# Patient Record
Sex: Female | Born: 1940 | Race: White | Hispanic: No | Smoking: Never smoker
Health system: Southern US, Community
[De-identification: ages and names within clinical notes are randomized; demographics above are authoritative.]

## PROBLEM LIST (undated history)

## (undated) DIAGNOSIS — E119 Type 2 diabetes mellitus without complications: Secondary | ICD-10-CM

## (undated) DIAGNOSIS — K589 Irritable bowel syndrome without diarrhea: Secondary | ICD-10-CM

## (undated) DIAGNOSIS — R569 Unspecified convulsions: Secondary | ICD-10-CM

## (undated) DIAGNOSIS — I639 Cerebral infarction, unspecified: Secondary | ICD-10-CM

## (undated) DIAGNOSIS — K921 Melena: Secondary | ICD-10-CM

## (undated) DIAGNOSIS — M519 Unspecified thoracic, thoracolumbar and lumbosacral intervertebral disc disorder: Secondary | ICD-10-CM

## (undated) DIAGNOSIS — K529 Noninfective gastroenteritis and colitis, unspecified: Secondary | ICD-10-CM

## (undated) DIAGNOSIS — K648 Other hemorrhoids: Secondary | ICD-10-CM

## (undated) DIAGNOSIS — I10 Essential (primary) hypertension: Secondary | ICD-10-CM

## (undated) HISTORY — DX: Other hemorrhoids: K64.8

## (undated) HISTORY — DX: Noninfective gastroenteritis and colitis, unspecified: K52.9

## (undated) HISTORY — PX: OTHER SURGICAL HISTORY: SHX169

## (undated) HISTORY — PX: APPENDECTOMY: SHX54

## (undated) HISTORY — DX: Irritable bowel syndrome without diarrhea: K58.9

## (undated) HISTORY — DX: Unspecified thoracic, thoracolumbar and lumbosacral intervertebral disc disorder: M51.9

## (undated) HISTORY — PX: BREAST REDUCTION SURGERY: SHX8

## (undated) HISTORY — PX: CHOLECYSTECTOMY: SHX55

## (undated) HISTORY — DX: Unspecified convulsions: R56.9

## (undated) HISTORY — PX: ABDOMINAL HYSTERECTOMY: SHX81

## (undated) HISTORY — DX: Melena: K92.1

## (undated) HISTORY — PX: FOOT SURGERY: SHX648

---

## 2000-08-14 ENCOUNTER — Encounter: Admission: RE | Admit: 2000-08-14 | Discharge: 2000-08-14 | Payer: Self-pay | Admitting: Family Medicine

## 2000-08-14 ENCOUNTER — Encounter: Payer: Self-pay | Admitting: Family Medicine

## 2002-02-18 HISTORY — PX: COLONOSCOPY: SHX174

## 2004-02-19 HISTORY — PX: COLONOSCOPY: SHX174

## 2004-06-29 ENCOUNTER — Ambulatory Visit: Payer: Self-pay | Admitting: Internal Medicine

## 2004-07-04 ENCOUNTER — Ambulatory Visit (HOSPITAL_COMMUNITY): Admission: RE | Admit: 2004-07-04 | Discharge: 2004-07-04 | Payer: Self-pay | Admitting: Internal Medicine

## 2004-07-04 ENCOUNTER — Ambulatory Visit: Payer: Self-pay | Admitting: Internal Medicine

## 2004-08-30 ENCOUNTER — Ambulatory Visit: Payer: Self-pay | Admitting: Internal Medicine

## 2005-01-17 ENCOUNTER — Ambulatory Visit: Payer: Self-pay | Admitting: Internal Medicine

## 2005-07-10 ENCOUNTER — Ambulatory Visit: Payer: Self-pay | Admitting: Internal Medicine

## 2005-08-26 ENCOUNTER — Ambulatory Visit: Payer: Self-pay | Admitting: Internal Medicine

## 2006-03-27 ENCOUNTER — Ambulatory Visit: Payer: Self-pay | Admitting: Cardiovascular Disease

## 2006-03-27 LAB — CONVERTED CEMR LAB
BUN: 12 mg/dL (ref 6–23)
Basophils Absolute: 0 10*3/uL (ref 0.0–0.1)
Basophils Relative: 0.3 % (ref 0.0–1.0)
CO2: 29 meq/L (ref 19–32)
Calcium: 9.5 mg/dL (ref 8.4–10.5)
Chloride: 103 meq/L (ref 96–112)
Creatinine, Ser: 0.5 mg/dL (ref 0.4–1.2)
Eosinophils Absolute: 0.3 10*3/uL (ref 0.0–0.6)
Eosinophils Relative: 5.1 % — ABNORMAL HIGH (ref 0.0–5.0)
GFR calc Af Amer: 159 mL/min
GFR calc non Af Amer: 131 mL/min
Glucose, Bld: 133 mg/dL — ABNORMAL HIGH (ref 70–99)
HCT: 37.8 % (ref 36.0–46.0)
Hemoglobin: 13.2 g/dL (ref 12.0–15.0)
INR: 1.1 (ref 0.9–2.0)
Lymphocytes Relative: 20.7 % (ref 12.0–46.0)
MCHC: 34.9 g/dL (ref 30.0–36.0)
MCV: 89.8 fL (ref 78.0–100.0)
Monocytes Absolute: 0.7 10*3/uL (ref 0.2–0.7)
Monocytes Relative: 9.8 % (ref 3.0–11.0)
Neutro Abs: 4.3 10*3/uL (ref 1.4–7.7)
Neutrophils Relative %: 64.1 % (ref 43.0–77.0)
Platelets: 247 10*3/uL (ref 150–400)
Potassium: 5 meq/L (ref 3.5–5.1)
Prothrombin Time: 13.2 s (ref 10.0–14.0)
RBC: 4.21 M/uL (ref 3.87–5.11)
RDW: 12.2 % (ref 11.5–14.6)
Sodium: 140 meq/L (ref 135–145)
WBC: 6.7 10*3/uL (ref 4.5–10.5)
aPTT: 25 s — ABNORMAL LOW (ref 26.5–36.5)

## 2006-04-03 ENCOUNTER — Inpatient Hospital Stay (HOSPITAL_BASED_OUTPATIENT_CLINIC_OR_DEPARTMENT_OTHER): Admission: RE | Admit: 2006-04-03 | Discharge: 2006-04-03 | Payer: Self-pay | Admitting: Cardiovascular Disease

## 2006-04-03 ENCOUNTER — Ambulatory Visit: Payer: Self-pay | Admitting: Cardiovascular Disease

## 2007-11-30 LAB — BASIC METABOLIC PANEL
Creatinine: 0.7 mg/dL (ref 0.5–1.1)
Potassium: 5.5 mmol/L — AB (ref 3.4–5.3)

## 2007-11-30 LAB — HEPATIC FUNCTION PANEL
ALT: 39 U/L — AB (ref 7–35)
AST: 33 U/L (ref 13–35)

## 2010-09-16 DIAGNOSIS — I200 Unstable angina: Secondary | ICD-10-CM

## 2010-09-17 DIAGNOSIS — R072 Precordial pain: Secondary | ICD-10-CM

## 2010-09-19 ENCOUNTER — Encounter (INDEPENDENT_AMBULATORY_CARE_PROVIDER_SITE_OTHER): Payer: Self-pay

## 2010-10-15 ENCOUNTER — Encounter (INDEPENDENT_AMBULATORY_CARE_PROVIDER_SITE_OTHER): Payer: Self-pay | Admitting: Internal Medicine

## 2010-10-15 ENCOUNTER — Ambulatory Visit (INDEPENDENT_AMBULATORY_CARE_PROVIDER_SITE_OTHER): Payer: Medicare Other | Admitting: Internal Medicine

## 2010-10-15 VITALS — BP 116/62 | HR 84 | Temp 97.2°F | Ht 64.0 in | Wt 162.0 lb

## 2010-10-15 DIAGNOSIS — K529 Noninfective gastroenteritis and colitis, unspecified: Secondary | ICD-10-CM

## 2010-10-15 DIAGNOSIS — R197 Diarrhea, unspecified: Secondary | ICD-10-CM

## 2010-10-15 NOTE — Progress Notes (Signed)
Subjective:     Patient ID: Dorothy Carey, female   DOB: 1940/08/27, 70 y.o.   MRN: [id number]  HPI  Dorothy Carey presents today for f/u of her chronic diarrhea. She says she is doing good. As far as her stools are, she has diarrhea on occasion.  She may have diarrhea about once or twice a month.   Usually has one BM a day. Brown in color. No melena or rectal bleeding. Appetite is good. No weight loss. No abdominal pain.  She has actually gained 14 pounds since her least visit. Her last colonoscopy in 2006 Dr. Jena Gauss which revealed friable internal  Hemorrhoids which was likely source of her hematochezia..   Otherwise normal colon and terminal ileum. Her stool cultures have been normal in past.  Her blood sugars have been running over 200.    Current Outpatient Prescriptions  Medication Sig Dispense Refill  . aspirin 81 MG tablet Take 162 mg by mouth daily.       Marland Kitchen atorvastatin (LIPITOR) 20 MG tablet Take 20 mg by mouth daily.        Marland Kitchen dextromethorphan-guaiFENesin (MUCINEX DM) 30-600 MG per 12 hr tablet Take 1 tablet by mouth as needed.       . hydrochlorothiazide (,MICROZIDE/HYDRODIURIL,) 12.5 MG capsule Take 12.5 mg by mouth daily.        Marland Kitchen losartan (COZAAR) 100 MG tablet Take 100 mg by mouth daily.        . metFORMIN (GLUMETZA) 1000 MG (MOD) 24 hr tablet Take 1,000 mg by mouth daily with breakfast.        . pantoprazole (PROTONIX) 40 MG tablet Take 40 mg by mouth daily.        . phenytoin (DILANTIN) 100 MG ER capsule Take by mouth daily.        Marland Kitchen exenatide (BYETTA 5 MCG PEN) 5 MCG/0.02ML SOLN Inject into the skin 2 (two) times daily with a meal.        . fluticasone (FLOVENT DISKUS) 50 MCG/BLIST diskus inhaler Inhale 1 puff into the lungs 2 (two) times daily.        Marland Kitchen guar gum packet Take by mouth 3 (three) times daily with meals.        . phenytoin (DILANTIN) 300 MG ER capsule Take 300 mg by mouth 3 (three) times daily.        . pseudoephedrine-acetaminophen (TYLENOL SINUS) 30-500 MG TABS Take 1 tablet  by mouth every 4 (four) hours as needed.         Past Medical History  Diagnosis Date  . Ruptured disk   . Internal hemorrhoid   . Hematochezia   . Seizures   . Chronic diarrhea     Past Surgical History  Procedure Date  . Colonoscopy 06    ROURK  . Colonoscopy 04    FLEISHMAN     Review of Systems     Objective:   Physical Exam  Filed Vitals:   10/15/10 1045  BP: 116/62  Pulse: 84  Temp: 97.2 F (36.2 C)    Alert and oriented. Skin warm and dry. Oral mucosa is moist. Natural teeth in good condition. Sclera anicteric, conjunctivae is pink. Thyroid not enlarged. No cervical lymphadenopathy. Lungs clear. Heart regular rate and rhythm.  Abdomen is soft. Bowel sounds are positive. No hepatomegaly. No abdominal masses felt. No tenderness.  No edema to lower extremities. Patient is alert and oriented.      Assessment:    chronic diarrhea. She has  had much improvement in her diarrhea. Her diarrhea is only occuring one-two times a month. She is satisfied with her BM.  Plan:    She will  continue present medications.  She will f/u in one year. If she has any problems she will call our office.

## 2010-11-12 ENCOUNTER — Telehealth (INDEPENDENT_AMBULATORY_CARE_PROVIDER_SITE_OTHER): Payer: Self-pay | Admitting: *Deleted

## 2010-11-12 NOTE — Telephone Encounter (Signed)
She asked that Terri give her a call back.

## 2010-11-13 ENCOUNTER — Telehealth (INDEPENDENT_AMBULATORY_CARE_PROVIDER_SITE_OTHER): Payer: Self-pay | Admitting: *Deleted

## 2010-11-13 NOTE — Telephone Encounter (Signed)
I advised her to taken Immodium BID and to call with a progress report in 2 weeks. Take fiber also for the diarrhea.

## 2010-11-13 NOTE — Telephone Encounter (Signed)
Called again asking that Dorothy Carey give her a call back please.

## 2011-09-25 ENCOUNTER — Encounter (INDEPENDENT_AMBULATORY_CARE_PROVIDER_SITE_OTHER): Payer: Self-pay | Admitting: *Deleted

## 2011-10-15 ENCOUNTER — Ambulatory Visit (INDEPENDENT_AMBULATORY_CARE_PROVIDER_SITE_OTHER): Payer: Medicare Other | Admitting: Internal Medicine

## 2011-10-15 ENCOUNTER — Encounter (INDEPENDENT_AMBULATORY_CARE_PROVIDER_SITE_OTHER): Payer: Self-pay | Admitting: Internal Medicine

## 2011-10-15 VITALS — BP 152/58 | HR 80 | Temp 97.8°F | Ht 63.0 in | Wt 171.0 lb

## 2011-10-15 DIAGNOSIS — R197 Diarrhea, unspecified: Secondary | ICD-10-CM | POA: Insufficient documentation

## 2011-10-15 DIAGNOSIS — K589 Irritable bowel syndrome without diarrhea: Secondary | ICD-10-CM | POA: Insufficient documentation

## 2011-10-15 DIAGNOSIS — Z800 Family history of malignant neoplasm of digestive organs: Secondary | ICD-10-CM

## 2011-10-15 HISTORY — DX: Irritable bowel syndrome, unspecified: K58.9

## 2011-10-15 NOTE — Patient Instructions (Addendum)
Continue Imodium.  Will schedule a colonoscopy for family hx of colon cancer

## 2011-10-15 NOTE — Progress Notes (Signed)
Subjective:     Patient ID: Dorothy Carey, female   DOB: 09-04-1940, 71 y.o.   MRN: [id number]  HPI Dorothy Carey is 71 yr old here today for f/u of her diarrhea.  She is doing pretty good. She is having a stool once a day.  She will have diarrhea depending on what she eats.  Appetite is good. No weight loss.  No abdominal pain. Her last colonoscopy in 2006 Dr. Jena Gauss which revealed friable internal Hemorrhoids which was likely source of her hematochezia. Otherwise normal colon and terminal ileum.  She is presently taking Prednisone which has bumped her blood sugars up.        Review of Systems see hpi History   Social History  . Marital Status: Married    Spouse Name: N/A    Number of Children: N/A  . Years of Education: N/A   Occupational History  . Not on file.   Social History Main Topics  . Smoking status: Never Smoker   . Smokeless tobacco: Not on file  . Alcohol Use: No  . Drug Use: No  . Sexually Active: Not on file   Other Topics Concern  . Not on file   Social History Narrative  . No narrative on file   Past Medical History  Diagnosis Date  . Ruptured disk   . Internal hemorrhoid   . Hematochezia   . Seizures   . Chronic diarrhea    Current Outpatient Prescriptions on File Prior to Visit  Medication Sig Dispense Refill  . aspirin 81 MG tablet Take 162 mg by mouth daily.       Marland Kitchen gemfibrozil (LOPID) 600 MG tablet Take 600 mg by mouth 2 (two) times daily before a meal.      . hydrochlorothiazide (,MICROZIDE/HYDRODIURIL,) 12.5 MG capsule Take 12.5 mg by mouth daily.        Marland Kitchen losartan (COZAAR) 100 MG tablet Take 100 mg by mouth daily.        . pantoprazole (PROTONIX) 40 MG tablet Take 40 mg by mouth daily.        . phenytoin (DILANTIN) 100 MG ER capsule Take by mouth daily.        . phenytoin (DILANTIN) 300 MG ER capsule Take 300 mg by mouth 3 (three) times daily.        . pseudoephedrine-acetaminophen (TYLENOL SINUS) 30-500 MG TABS Take 1 tablet by mouth every 4 (four)  hours as needed.         History   Social History  . Marital Status: Married    Spouse Name: N/A    Number of Children: N/A  . Years of Education: N/A   Occupational History  . Not on file.   Social History Main Topics  . Smoking status: Never Smoker   . Smokeless tobacco: Not on file  . Alcohol Use: No  . Drug Use: No  . Sexually Active: Not on file   Other Topics Concern  . Not on file   Social History Narrative  . No narrative on file   Family Status  Relation Status Death Age  . Mother Deceased     CAD  . Father Deceased     Colon cancer. Deceased at age 74.   . Brother      One deaced from CAD, one in good health   No Known Allergies      Objective:   Physical Exam  Filed Vitals:   10/15/11 1425  BP: 152/58  Pulse:  80  Temp: 97.8 F (36.6 C)  Alert and oriented. Skin warm and dry. Oral mucosa is moist.   . Sclera anicteric, conjunctivae is pink. Thyroid not enlarged. No cervical lymphadenopathy. Lungs clear. Heart regular rate and rhythm.  Abdomen is soft. Bowel sounds are positive. No hepatomegaly. No abdominal masses felt. No tenderness.  No edema to lower extremities.  .       Assessment:    Diarrhea: She rarely has diarrhea. She has diarrhea with certain foods.  She is doing well.  Metformin has been discontinued.     Plan:   Continue present medications. Increase fiber in diet. OV in 1 yr. If any problems she will call our office.

## 2012-09-10 ENCOUNTER — Other Ambulatory Visit (HOSPITAL_COMMUNITY): Payer: Self-pay | Admitting: Family Medicine

## 2012-09-10 DIAGNOSIS — Z139 Encounter for screening, unspecified: Secondary | ICD-10-CM

## 2012-09-11 ENCOUNTER — Ambulatory Visit (HOSPITAL_COMMUNITY)
Admission: RE | Admit: 2012-09-11 | Discharge: 2012-09-11 | Disposition: A | Discharge status: Home / Self Care | Payer: Medicare Other | Source: Ambulatory Visit | Attending: Family Medicine | Admitting: Family Medicine

## 2012-09-11 DIAGNOSIS — Z139 Encounter for screening, unspecified: Secondary | ICD-10-CM

## 2012-09-11 DIAGNOSIS — Z1231 Encounter for screening mammogram for malignant neoplasm of breast: Secondary | ICD-10-CM | POA: Insufficient documentation

## 2012-10-05 ENCOUNTER — Ambulatory Visit (INDEPENDENT_AMBULATORY_CARE_PROVIDER_SITE_OTHER)
Admission: RE | Admit: 2012-10-05 | Discharge: 2012-10-05 | Disposition: A | Discharge status: Home / Self Care | Payer: Medicare Other | Source: Ambulatory Visit | Attending: Pulmonary Disease | Admitting: Pulmonary Disease

## 2012-10-05 ENCOUNTER — Other Ambulatory Visit (INDEPENDENT_AMBULATORY_CARE_PROVIDER_SITE_OTHER): Payer: Medicare Other

## 2012-10-05 ENCOUNTER — Encounter: Payer: Self-pay | Admitting: *Deleted

## 2012-10-05 ENCOUNTER — Ambulatory Visit (INDEPENDENT_AMBULATORY_CARE_PROVIDER_SITE_OTHER): Payer: Medicare Other | Admitting: Pulmonary Disease

## 2012-10-05 ENCOUNTER — Encounter (INDEPENDENT_AMBULATORY_CARE_PROVIDER_SITE_OTHER): Payer: Self-pay | Admitting: *Deleted

## 2012-10-05 VITALS — BP 140/72 | HR 93 | Temp 97.4°F | Ht 63.5 in | Wt 177.0 lb

## 2012-10-05 DIAGNOSIS — R0602 Shortness of breath: Secondary | ICD-10-CM

## 2012-10-05 DIAGNOSIS — R079 Chest pain, unspecified: Secondary | ICD-10-CM

## 2012-10-05 LAB — BASIC METABOLIC PANEL
GFR: 62.8 mL/min (ref >60.00)
Glucose, Bld: 216 mg/dL — ABNORMAL HIGH (ref 70–99)
Potassium: 4.7 mEq/L (ref 3.5–5.1)
Sodium: 138 mEq/L (ref 135–145)

## 2012-10-05 LAB — CBC WITH DIFFERENTIAL/PLATELET
Basophils Relative: 0.7 % (ref 0.0–3.0)
Eosinophils Relative: 3 % (ref 0.0–5.0)
HCT: 37.3 % (ref 36.0–46.0)
Hemoglobin: 12.8 g/dL (ref 12.0–15.0)
Lymphs Abs: 1 10*3/uL (ref 0.7–4.0)
Monocytes Relative: 10.8 % (ref 3.0–12.0)
Neutro Abs: 4.7 10*3/uL (ref 1.4–7.7)
RBC: 4.21 Mil/uL (ref 3.87–5.11)
WBC: 6.7 10*3/uL (ref 4.5–10.5)

## 2012-10-05 MED ORDER — IOHEXOL 350 MG/ML SOLN
80.0000 mL | Freq: Once | INTRAVENOUS | Status: AC | PRN
  Administered 2012-10-05: 80 mL via INTRAVENOUS

## 2012-10-05 NOTE — Progress Notes (Signed)
Subjective:    Patient ID: Dorothy Carey, female    DOB: [birth year], 72 y.o.   MRN: [id number]  HPI  This is a very pleasant 72 year old female who comes her clinic today for evaluation of shortness of breath. She states that this came on suddenly approximately 8 weeks ago. She describes a normal childhood without respiratory illnesses and she never smoked. Throughout her adult life she has been fine from a respiratory standpoint and has never been told that she has underlying cardiac disease. She had a left heart catheterization 8-9 years ago and was told that it was normal. Up until about 8 weeks ago she was walking nearly 1 hour a day. Typically she would walk 30 minutes in the morning and then 30 minutes again in the evening. She started having knee pain 8 weeks ago and stated that she didn't do any walking for a solid week. Then when she started to try to walk back she felt significantly short of breath. She's also noted a little bit of swelling in that leg as well. The shortness of breath has definitely persisted and not improved she thinks perhaps it may have worsened slightly. It is also been associated with a sharp pain in the left chest which radiates to the axilla. The only thing that is made this better is taking aspirin.  She has had a chronic dry cough for quite some time which has been unchanged during all of this. She never has much in the way of sputum production.she states that she gets bronchitis about once a year but this has not been a problem for her lately. There is some question about whether or not a chest x-ray in July 2014 showed pneumonia, but unfortunately on not able to see either the report or the images from the study.  Past Medical History  Diagnosis Date  . Ruptured disk   . Internal hemorrhoid   . Hematochezia   . Seizures   . Chronic diarrhea   . IBS (irritable bowel syndrome) 10/15/2011     No family history on file.   History   Social History  . Marital  Status: Married    Spouse Name: N/A    Number of Children: N/A  . Years of Education: N/A   Occupational History  . Not on file.   Social History Main Topics  . Smoking status: Never Smoker   . Smokeless tobacco: Not on file  . Alcohol Use: No  . Drug Use: No  . Sexual Activity: Not on file   Other Topics Concern  . Not on file   Social History Narrative  . No narrative on file     No Known Allergies   Outpatient Prescriptions Prior to Visit  Medication Sig Dispense Refill  . acetaminophen (TYLENOL) 500 MG tablet Take 500 mg by mouth every 6 (six) hours as needed.      Marland Kitchen aspirin 81 MG tablet Take 162 mg by mouth daily.       . Canagliflozin (INVOKANA) 100 MG TABS Take by mouth.      . clobetasol cream (TEMOVATE) 0.05 % Apply topically 2 (two) times daily.      . fish oil-omega-3 fatty acids 1000 MG capsule Take 2 g by mouth 2 (two) times daily.      Marland Kitchen gemfibrozil (LOPID) 600 MG tablet Take 600 mg by mouth 2 (two) times daily before a meal.      . hydrochlorothiazide (,MICROZIDE/HYDRODIURIL,) 12.5 MG capsule Take 12.5 mg  by mouth daily.        . insulin aspart (NOVOLOG) 100 UNIT/ML injection Inject 15 Units into the skin 3 (three) times daily before meals.      . insulin detemir (LEVEMIR) 100 UNIT/ML injection Inject 60 Units into the skin at bedtime.      Marland Kitchen loperamide (IMODIUM) 2 MG capsule Take 2 mg by mouth 4 (four) times daily as needed.      Marland Kitchen losartan (COZAAR) 100 MG tablet Take 100 mg by mouth daily.        . pantoprazole (PROTONIX) 40 MG tablet Take 40 mg by mouth daily.        . phenytoin (DILANTIN) 100 MG ER capsule Take by mouth daily.        . phenytoin (DILANTIN) 300 MG ER capsule Take 300 mg by mouth 3 (three) times daily.        . pseudoephedrine-acetaminophen (TYLENOL SINUS) 30-500 MG TABS Take 1 tablet by mouth every 4 (four) hours as needed.        . predniSONE (DELTASONE) 20 MG tablet Take 20 mg by mouth daily.       No facility-administered medications  prior to visit.      Review of Systems  Constitutional: Negative for fever, chills, diaphoresis, activity change, appetite change, fatigue and unexpected weight change.  HENT: Negative for hearing loss, ear pain, nosebleeds, congestion, sore throat, facial swelling, rhinorrhea, sneezing, mouth sores, trouble swallowing, neck pain, neck stiffness, dental problem, voice change, postnasal drip, sinus pressure, tinnitus and ear discharge.   Eyes: Negative for photophobia, discharge, itching and visual disturbance.  Respiratory: Positive for cough and shortness of breath. Negative for apnea, choking, chest tightness, wheezing and stridor.   Cardiovascular: Negative for chest pain, palpitations and leg swelling.  Gastrointestinal: Negative for nausea, vomiting, abdominal pain, constipation, blood in stool and abdominal distention.  Genitourinary: Negative for dysuria, urgency, frequency, hematuria, flank pain, decreased urine volume and difficulty urinating.  Musculoskeletal: Negative for myalgias, back pain, joint swelling, arthralgias and gait problem.  Skin: Negative for color change, pallor and rash.  Neurological: Negative for dizziness, tremors, seizures, syncope, speech difficulty, weakness, light-headedness, numbness and headaches.  Hematological: Negative for adenopathy. Does not bruise/bleed easily.  Psychiatric/Behavioral: Negative for confusion, sleep disturbance and agitation. The patient is not nervous/anxious.        Objective:   Physical Exam  Filed Vitals:   10/05/12 0938  BP: 140/72  Pulse: 93  Temp: 97.4 F (36.3 C)  TempSrc: Oral  Height: 5' 3.5" (1.613 m)  Weight: 177 lb (80.287 kg)  SpO2: 96%   Gen: well appearing, no acute distress HEENT: NCAT, PERRL, EOMi, OP clear, neck supple without masses PULM: Crackles in bases bilaterally, no wheezing, normal percussion CV: RRR, systolic murmur LLSB, no JVD AB: BS+, soft, nontender, no hsm Ext: warm, no edema, no  clubbing, no cyanosis Derm: no rash or skin breakdown Neuro: A&Ox4, CN II-XII intact, strength 5/5 in all 4 extremities       Assessment & Plan:   Shortness of breath I am primarily worried about 2 major issues here in this diabetic female with shortness of breath. One concern about pulmonary embolism as the shortness of breath started abruptly after a period of immobility with leg pain.I am also concerned about possible coronary disease given the left-sided chest pain which has been associated with the shortness of breath in the last several weeks. She has also had a significant family history of coronary artery disease.  At this point, she needs an extensive workup for her shortness of breath and we will start with a CT angiogram for chest look for pulmonary embolism today.I am encouraged by the fact that her office oximetry was normal with walking today.  She has some crackles on lung auscultation today but her exam is otherwise not consistent with volume overload.  Plan: -CBC, bmet now -full PFT's -CT angiogram chest to look for pulmonary embolism today -Stress echo -Followup 2 weeks    Updated Medication List Outpatient Encounter Prescriptions as of 10/05/2012  Medication Sig Dispense Refill  . acetaminophen (TYLENOL) 500 MG tablet Take 500 mg by mouth every 6 (six) hours as needed.      Marland Kitchen aspirin 81 MG tablet Take 162 mg by mouth daily.       . Canagliflozin (INVOKANA) 100 MG TABS Take by mouth.      . clobetasol cream (TEMOVATE) 0.05 % Apply topically 2 (two) times daily.      . fish oil-omega-3 fatty acids 1000 MG capsule Take 2 g by mouth 2 (two) times daily.      Marland Kitchen gemfibrozil (LOPID) 600 MG tablet Take 600 mg by mouth 2 (two) times daily before a meal.      . hydrochlorothiazide (,MICROZIDE/HYDRODIURIL,) 12.5 MG capsule Take 12.5 mg by mouth daily.        . insulin aspart (NOVOLOG) 100 UNIT/ML injection Inject 15 Units into the skin 3 (three) times daily before meals.       . insulin detemir (LEVEMIR) 100 UNIT/ML injection Inject 60 Units into the skin at bedtime.      Marland Kitchen loperamide (IMODIUM) 2 MG capsule Take 2 mg by mouth 4 (four) times daily as needed.      Marland Kitchen losartan (COZAAR) 100 MG tablet Take 100 mg by mouth daily.        . metFORMIN (GLUCOPHAGE) 1000 MG tablet Take 1,000 mg by mouth 2 (two) times daily with a meal.      . pantoprazole (PROTONIX) 40 MG tablet Take 40 mg by mouth daily.        . phenytoin (DILANTIN) 100 MG ER capsule Take by mouth daily.        . phenytoin (DILANTIN) 30 MG ER capsule Take 30 mg by mouth daily.      . phenytoin (DILANTIN) 300 MG ER capsule Take 300 mg by mouth 3 (three) times daily.        . pseudoephedrine-acetaminophen (TYLENOL SINUS) 30-500 MG TABS Take 1 tablet by mouth every 4 (four) hours as needed.        . [DISCONTINUED] predniSONE (DELTASONE) 20 MG tablet Take 20 mg by mouth daily.       No facility-administered encounter medications on file as of 10/05/2012.

## 2012-10-05 NOTE — Assessment & Plan Note (Addendum)
I am primarily worried about 2 major issues here in this diabetic female with shortness of breath. One concern about pulmonary embolism as the shortness of breath started abruptly after a period of immobility with leg pain.I am also concerned about possible coronary disease given the left-sided chest pain which has been associated with the shortness of breath in the last several weeks. She has also had a significant family history of coronary artery disease. At this point, she needs an extensive workup for her shortness of breath and we will start with a CT angiogram for chest look for pulmonary embolism today.I am encouraged by the fact that her office oximetry was normal with walking today.  She has some crackles on lung auscultation today but her exam is otherwise not consistent with volume overload.  Plan: -CBC, bmet now -full PFT's -CT angiogram chest to look for pulmonary embolism today -Stress echo -Followup 2 weeks

## 2012-10-05 NOTE — Patient Instructions (Signed)
Take a baby Aspirin daily  We will set up a stress test to evaluate your chest pain If the pain lasts longer than ten minutes then you should go to the emergency room We will set up a CT scan of your chest today We will set up lung function testing for you  We will see you back in 2-3 weeks or sooner if needed

## 2012-10-06 ENCOUNTER — Encounter: Payer: Self-pay | Admitting: Pulmonary Disease

## 2012-10-07 ENCOUNTER — Ambulatory Visit (INDEPENDENT_AMBULATORY_CARE_PROVIDER_SITE_OTHER): Payer: Medicare Other | Admitting: Pulmonary Disease

## 2012-10-07 DIAGNOSIS — R0602 Shortness of breath: Secondary | ICD-10-CM

## 2012-10-07 LAB — PULMONARY FUNCTION TEST

## 2012-10-07 NOTE — Progress Notes (Signed)
PFT done today. 

## 2012-10-14 ENCOUNTER — Ambulatory Visit (INDEPENDENT_AMBULATORY_CARE_PROVIDER_SITE_OTHER): Payer: Medicare Other | Admitting: Internal Medicine

## 2012-10-15 ENCOUNTER — Ambulatory Visit (HOSPITAL_BASED_OUTPATIENT_CLINIC_OR_DEPARTMENT_OTHER): Payer: Medicare Other

## 2012-10-15 ENCOUNTER — Ambulatory Visit (HOSPITAL_COMMUNITY): Payer: Medicare Other | Attending: Pulmonary Disease

## 2012-10-15 DIAGNOSIS — R0989 Other specified symptoms and signs involving the circulatory and respiratory systems: Secondary | ICD-10-CM

## 2012-10-15 DIAGNOSIS — R0602 Shortness of breath: Secondary | ICD-10-CM

## 2012-10-15 NOTE — Progress Notes (Signed)
Stress echo cancelled due to patient LBBB on EKG.  Patient is scheduled for a myoview.

## 2012-10-19 ENCOUNTER — Observation Stay (HOSPITAL_COMMUNITY)
Admission: EM | Admit: 2012-10-19 | Discharge: 2012-10-22 | Disposition: A | Discharge status: Home / Self Care | Payer: Medicare Other | Attending: Family Medicine | Admitting: Family Medicine

## 2012-10-19 ENCOUNTER — Encounter (HOSPITAL_COMMUNITY): Payer: Self-pay | Admitting: *Deleted

## 2012-10-19 ENCOUNTER — Emergency Department (HOSPITAL_COMMUNITY): Payer: Medicare Other

## 2012-10-19 ENCOUNTER — Other Ambulatory Visit: Payer: Self-pay

## 2012-10-19 DIAGNOSIS — Z79899 Other long term (current) drug therapy: Secondary | ICD-10-CM | POA: Insufficient documentation

## 2012-10-19 DIAGNOSIS — K589 Irritable bowel syndrome without diarrhea: Secondary | ICD-10-CM | POA: Insufficient documentation

## 2012-10-19 DIAGNOSIS — E785 Hyperlipidemia, unspecified: Secondary | ICD-10-CM | POA: Insufficient documentation

## 2012-10-19 DIAGNOSIS — E119 Type 2 diabetes mellitus without complications: Secondary | ICD-10-CM | POA: Diagnosis present

## 2012-10-19 DIAGNOSIS — R197 Diarrhea, unspecified: Secondary | ICD-10-CM

## 2012-10-19 DIAGNOSIS — R079 Chest pain, unspecified: Secondary | ICD-10-CM | POA: Insufficient documentation

## 2012-10-19 DIAGNOSIS — Z794 Long term (current) use of insulin: Secondary | ICD-10-CM | POA: Insufficient documentation

## 2012-10-19 DIAGNOSIS — R9431 Abnormal electrocardiogram [ECG] [EKG]: Secondary | ICD-10-CM

## 2012-10-19 DIAGNOSIS — E7800 Pure hypercholesterolemia, unspecified: Secondary | ICD-10-CM

## 2012-10-19 DIAGNOSIS — R0989 Other specified symptoms and signs involving the circulatory and respiratory systems: Principal | ICD-10-CM | POA: Insufficient documentation

## 2012-10-19 DIAGNOSIS — I10 Essential (primary) hypertension: Secondary | ICD-10-CM

## 2012-10-19 DIAGNOSIS — G40909 Epilepsy, unspecified, not intractable, without status epilepticus: Secondary | ICD-10-CM | POA: Insufficient documentation

## 2012-10-19 DIAGNOSIS — Z7982 Long term (current) use of aspirin: Secondary | ICD-10-CM | POA: Insufficient documentation

## 2012-10-19 DIAGNOSIS — R0602 Shortness of breath: Secondary | ICD-10-CM

## 2012-10-19 DIAGNOSIS — R0609 Other forms of dyspnea: Principal | ICD-10-CM | POA: Insufficient documentation

## 2012-10-19 DIAGNOSIS — K648 Other hemorrhoids: Secondary | ICD-10-CM | POA: Insufficient documentation

## 2012-10-19 DIAGNOSIS — I447 Left bundle-branch block, unspecified: Secondary | ICD-10-CM | POA: Insufficient documentation

## 2012-10-19 HISTORY — DX: Essential (primary) hypertension: I10

## 2012-10-19 HISTORY — DX: Type 2 diabetes mellitus without complications: E11.9

## 2012-10-19 LAB — COMPREHENSIVE METABOLIC PANEL
ALT: 24 U/L (ref 0–35)
AST: 22 U/L (ref 0–37)
Albumin: 3.7 g/dL (ref 3.5–5.2)
Alkaline Phosphatase: 136 U/L — ABNORMAL HIGH (ref 39–117)
CO2: 22 mEq/L (ref 19–32)
Chloride: 101 mEq/L (ref 96–112)
Creatinine, Ser: 0.77 mg/dL (ref 0.50–1.10)
GFR calc non Af Amer: 81 mL/min — ABNORMAL LOW (ref >90)
Potassium: 5 mEq/L (ref 3.5–5.1)
Total Bilirubin: 0.3 mg/dL (ref 0.3–1.2)

## 2012-10-19 LAB — CBC WITH DIFFERENTIAL/PLATELET
Hemoglobin: 13.4 g/dL (ref 12.0–15.0)
Lymphocytes Relative: 21 % (ref 12–46)
Lymphs Abs: 1.6 10*3/uL (ref 0.7–4.0)
MCH: 30.2 pg (ref 26.0–34.0)
Monocytes Relative: 7 % (ref 3–12)
Neutro Abs: 5.1 10*3/uL (ref 1.7–7.7)
Neutrophils Relative %: 67 % (ref 43–77)
Platelets: 260 10*3/uL (ref 150–400)
RBC: 4.44 MIL/uL (ref 3.87–5.11)
WBC: 7.7 10*3/uL (ref 4.0–10.5)

## 2012-10-19 LAB — GLUCOSE, CAPILLARY: Glucose-Capillary: 164 mg/dL — ABNORMAL HIGH (ref 70–99)

## 2012-10-19 LAB — TROPONIN I
Troponin I: <0.3 ng/mL (ref <0.30)
Troponin I: <0.3 ng/mL (ref <0.30)

## 2012-10-19 MED ORDER — PANTOPRAZOLE SODIUM 40 MG PO TBEC
40.0000 mg | DELAYED_RELEASE_TABLET | Freq: Two times a day (BID) | ORAL | Status: DC
  Administered 2012-10-19 – 2012-10-22 (×6): 40 mg via ORAL
  Filled 2012-10-19 (×7): qty 1

## 2012-10-19 MED ORDER — HYDROCODONE-ACETAMINOPHEN 5-325 MG PO TABS
1.0000 | ORAL_TABLET | ORAL | Status: DC | PRN
  Administered 2012-10-19 (×2): 1 via ORAL
  Filled 2012-10-19 (×2): qty 1

## 2012-10-19 MED ORDER — SODIUM CHLORIDE 0.9 % IV SOLN: 250.0000 mL | INTRAVENOUS | Status: DC | PRN

## 2012-10-19 MED ORDER — FUROSEMIDE 10 MG/ML IJ SOLN
20.0000 mg | Freq: Once | INTRAMUSCULAR | Status: AC
  Administered 2012-10-19: 20 mg via INTRAVENOUS
  Filled 2012-10-19: qty 2

## 2012-10-19 MED ORDER — INSULIN DETEMIR 100 UNIT/ML ~~LOC~~ SOLN
SUBCUTANEOUS | Status: AC
  Filled 2012-10-19: qty 1

## 2012-10-19 MED ORDER — LORATADINE 10 MG PO TABS
10.0000 mg | ORAL_TABLET | Freq: Every day | ORAL | Status: DC
  Administered 2012-10-19 – 2012-10-22 (×4): 10 mg via ORAL
  Filled 2012-10-19 (×4): qty 1

## 2012-10-19 MED ORDER — HYDROCHLOROTHIAZIDE 12.5 MG PO CAPS
12.5000 mg | ORAL_CAPSULE | Freq: Every day | ORAL | Status: DC
Start: 2012-10-20 — End: 2012-10-22
  Administered 2012-10-20 – 2012-10-22 (×3): 12.5 mg via ORAL
  Filled 2012-10-19 (×3): qty 1

## 2012-10-19 MED ORDER — ALBUTEROL SULFATE (5 MG/ML) 0.5% IN NEBU: 2.5000 mg | INHALATION_SOLUTION | RESPIRATORY_TRACT | Status: DC | PRN

## 2012-10-19 MED ORDER — GEMFIBROZIL 600 MG PO TABS
600.0000 mg | ORAL_TABLET | Freq: Two times a day (BID) | ORAL | Status: DC
  Administered 2012-10-20 – 2012-10-21 (×2): 600 mg via ORAL
  Filled 2012-10-19 (×2): qty 1

## 2012-10-19 MED ORDER — SODIUM CHLORIDE 0.9 % IJ SOLN
3.0000 mL | Freq: Two times a day (BID) | INTRAMUSCULAR | Status: DC
  Administered 2012-10-19 – 2012-10-21 (×4): 3 mL via INTRAVENOUS

## 2012-10-19 MED ORDER — ASPIRIN EC 81 MG PO TBEC
81.0000 mg | DELAYED_RELEASE_TABLET | Freq: Every day | ORAL | Status: DC
  Administered 2012-10-19 – 2012-10-22 (×4): 81 mg via ORAL
  Filled 2012-10-19 (×4): qty 1

## 2012-10-19 MED ORDER — SODIUM CHLORIDE 0.9 % IJ SOLN: 3.0000 mL | INTRAMUSCULAR | Status: DC | PRN

## 2012-10-19 MED ORDER — PHENYTOIN SODIUM EXTENDED 100 MG PO CAPS
400.0000 mg | ORAL_CAPSULE | Freq: Every day | ORAL | Status: DC
  Administered 2012-10-19 – 2012-10-22 (×4): 400 mg via ORAL
  Filled 2012-10-19 (×4): qty 4

## 2012-10-19 MED ORDER — LOSARTAN POTASSIUM 50 MG PO TABS
100.0000 mg | ORAL_TABLET | Freq: Every day | ORAL | Status: DC
  Administered 2012-10-19 – 2012-10-22 (×4): 100 mg via ORAL
  Filled 2012-10-19 (×4): qty 2

## 2012-10-19 MED ORDER — INSULIN DETEMIR 100 UNIT/ML ~~LOC~~ SOLN
70.0000 [IU] | Freq: Every day | SUBCUTANEOUS | Status: DC
  Administered 2012-10-19 – 2012-10-21 (×2): 70 [IU] via SUBCUTANEOUS
  Filled 2012-10-19 (×3): qty 0.7

## 2012-10-19 MED ORDER — ENOXAPARIN SODIUM 40 MG/0.4ML ~~LOC~~ SOLN
40.0000 mg | SUBCUTANEOUS | Status: DC
  Administered 2012-10-19 – 2012-10-21 (×3): 40 mg via SUBCUTANEOUS
  Filled 2012-10-19 (×3): qty 0.4

## 2012-10-19 MED ORDER — INSULIN ASPART 100 UNIT/ML ~~LOC~~ SOLN
0.0000 [IU] | Freq: Three times a day (TID) | SUBCUTANEOUS | Status: DC
  Administered 2012-10-20 (×2): 1 [IU] via SUBCUTANEOUS
  Administered 2012-10-21: 7 [IU] via SUBCUTANEOUS
  Administered 2012-10-21: 2 [IU] via SUBCUTANEOUS
  Administered 2012-10-22: 1 [IU] via SUBCUTANEOUS

## 2012-10-19 NOTE — H&P (Signed)
PCP:   Estanislado Pandy, MD   Chief Complaint:  Chest pain  HPI: 72 year old female who has been complaining of shortness of breath for the past 2 months and has had pulmonary workup 2 weeks ago. She had a CT angiogram which ruled out pulmonary embolism, and had scheduled for cardiac stress test tomorrow on 10/20/2012. Patient came to the ED as she woke up around 3 AM this morning and chest pain, she took 2 baby aspirin which relieved the pain but came back after some time. The pain was associated with shortness of breath, and became worse on walking. At this time patient is chest pain-free, she denies shortness of breath. Patient has mildly elevated d-dimer, and had recent CT angiogram which ruled out pulmonary embolism. She denies fever, no nausea vomiting or diarrhea, no cough.  Allergies:  No Known Allergies    Past Medical History  Diagnosis Date  . Ruptured disk   . Internal hemorrhoid   . Hematochezia   . Seizures   . Chronic diarrhea   . IBS (irritable bowel syndrome) 10/15/2011  . Diabetes mellitus without complication   . Hypertension     Past Surgical History  Procedure Laterality Date  . Colonoscopy  06    ROURK  . Colonoscopy  04    FLEISHMAN  . Breast reduction surgery    . Foot surgery      Bone spurs  . Abdominal hysterectomy    . Appendectomy    . Bladder tac      Prior to Admission medications   Medication Sig Start Date End Date Taking? Authorizing Provider  aspirin EC 81 MG tablet Take 81 mg by mouth daily.   Yes Historical Provider, MD  clobetasol cream (TEMOVATE) 0.05 % Apply topically 2 (two) times daily.   Yes Historical Provider, MD  gemfibrozil (LOPID) 600 MG tablet Take 600 mg by mouth 2 (two) times daily before a meal.   Yes Historical Provider, MD  hydrochlorothiazide (,MICROZIDE/HYDRODIURIL,) 12.5 MG capsule Take 12.5 mg by mouth daily.     Yes Historical Provider, MD  ibuprofen (ADVIL,MOTRIN) 100 MG tablet Take 200 mg by mouth every 6 (six)  hours as needed for fever.   Yes Historical Provider, MD  Insulin Detemir (LEVEMIR FLEXPEN) 100 UNIT/ML SOPN Inject 70 Units into the skin at bedtime.   Yes Historical Provider, MD  insulin lispro (HUMALOG KWIKPEN) 100 UNIT/ML SOPN Inject 20 Units into the skin 3 (three) times daily with meals. Uses sliding scale: 90-150=20, 151-200=21, 201-250=22, 251-300=23, 301-350=24, 351-400=25, 400 or more=26.   Yes Historical Provider, MD  loperamide (IMODIUM) 2 MG capsule Take 2 mg by mouth 4 (four) times daily as needed for diarrhea or loose stools.    Yes Historical Provider, MD  loratadine (CLARITIN) 10 MG tablet Take 10 mg by mouth daily.   Yes Historical Provider, MD  losartan (COZAAR) 100 MG tablet Take 100 mg by mouth daily.     Yes Historical Provider, MD  pantoprazole (PROTONIX) 40 MG tablet Take 40 mg by mouth 2 (two) times daily.    Yes Historical Provider, MD  phenytoin (DILANTIN) 100 MG ER capsule Take 400 mg by mouth daily.    Yes Historical Provider, MD    Social History:  reports that she has never smoked. She does not have any smokeless tobacco history on file. She reports that she does not drink alcohol or use illicit drugs.  Family history: Noncontributory  All the positives are listed in BOLD  Review  of Systems:  HEENT: Headache, blurred vision, runny nose, sore throat Neck: Hypothyroidism, hyperthyroidism,,lymphadenopathy Chest : Shortness of breath, history of COPD, Asthma Heart : Chest pain, history of coronary arterey disease GI:  Nausea, vomiting, diarrhea, constipation, GERD GU: Dysuria, urgency, frequency of urination, hematuria Neuro: Stroke, seizures, syncope Psych: Depression, anxiety, hallucinations   Physical Exam: Blood pressure 148/54, pulse 96, temperature 99 F (37.2 C), temperature source Axillary, resp. rate 18, height 5\' 3"  (1.6 m), weight 79.1 kg (174 lb 6.1 oz), SpO2 94.00%. Constitutional:   Patient is a well-developed and well-nourished female* in no  acute distress and cooperative with exam. Head: Normocephalic and atraumatic Mouth: Mucus membranes moist Eyes: PERRL, EOMI, conjunctivae normal Neck: Supple, No Thyromegaly Cardiovascular: RRR, S1 normal, S2 normal Pulmonary/Chest: Bibasilar crackles Abdominal: Soft. Non-tender, non-distended, bowel sounds are normal, no masses, organomegaly, or guarding present.  Neurological: A&O x3, Strenght is normal and symmetric bilaterally, cranial nerve II-XII are grossly intact, no focal motor deficit, sensory intact to light touch bilaterally.  Extremities : No Cyanosis, Clubbing or Edema   Labs on Admission:  Results for orders placed during the hospital encounter of 10/19/12 (from the past 48 hour(s))  CBC WITH DIFFERENTIAL     Status: None   Collection Time    10/19/12 11:24 AM      Result Value Range   WBC 7.7  4.0 - 10.5 K/uL   RBC 4.44  3.87 - 5.11 MIL/uL   Hemoglobin 13.4  12.0 - 15.0 g/dL   HCT 16.1  09.6 - 04.5 %   MCV 89.6  78.0 - 100.0 fL   MCH 30.2  26.0 - 34.0 pg   MCHC 33.7  30.0 - 36.0 g/dL   RDW 40.9  81.1 - 91.4 %   Platelets 260  150 - 400 K/uL   Neutrophils Relative % 67  43 - 77 %   Neutro Abs 5.1  1.7 - 7.7 K/uL   Lymphocytes Relative 21  12 - 46 %   Lymphs Abs 1.6  0.7 - 4.0 K/uL   Monocytes Relative 7  3 - 12 %   Monocytes Absolute 0.6  0.1 - 1.0 K/uL   Eosinophils Relative 4  0 - 5 %   Eosinophils Absolute 0.3  0.0 - 0.7 K/uL   Basophils Relative 1  0 - 1 %   Basophils Absolute 0.1  0.0 - 0.1 K/uL  COMPREHENSIVE METABOLIC PANEL     Status: Abnormal   Collection Time    10/19/12 11:24 AM      Result Value Range   Sodium 137  135 - 145 mEq/L   Potassium 5.0  3.5 - 5.1 mEq/L   Chloride 101  96 - 112 mEq/L   CO2 22  19 - 32 mEq/L   Glucose, Bld 195 (*) 70 - 99 mg/dL   BUN 20  6 - 23 mg/dL   Creatinine, Ser 7.82  0.50 - 1.10 mg/dL   Calcium 95.6  8.4 - 21.3 mg/dL   Total Protein 8.4 (*) 6.0 - 8.3 g/dL   Albumin 3.7  3.5 - 5.2 g/dL   AST 22  0 - 37 U/L    ALT 24  0 - 35 U/L   Alkaline Phosphatase 136 (*) 39 - 117 U/L   Total Bilirubin 0.3  0.3 - 1.2 mg/dL   GFR calc non Af Amer 81 (*) >90 mL/min   GFR calc Af Amer >90  >90 mL/min   Comment: (NOTE)  The eGFR has been calculated using the CKD EPI equation.     This calculation has not been validated in all clinical situations.     eGFR's persistently <90 mL/min signify possible Chronic Kidney     Disease.  PRO B NATRIURETIC PEPTIDE     Status: Abnormal   Collection Time    10/19/12 11:24 AM      Result Value Range   Pro B Natriuretic peptide (BNP) 160.7 (*) 0 - 125 pg/mL  TROPONIN I     Status: None   Collection Time    10/19/12 11:24 AM      Result Value Range   Troponin I <0.30  <0.30 ng/mL   Comment:            Due to the release kinetics of cTnI,     a negative result within the first hours     of the onset of symptoms does not rule out     myocardial infarction with certainty.     If myocardial infarction is still suspected,     repeat the test at appropriate intervals.  D-DIMER, QUANTITATIVE     Status: Abnormal   Collection Time    10/19/12 11:24 AM      Result Value Range   D-Dimer, Quant 0.57 (*) 0.00 - 0.48 ug/mL-FEU   Comment:            AT THE INHOUSE ESTABLISHED CUTOFF     VALUE OF 0.48 ug/mL FEU,     THIS ASSAY HAS BEEN DOCUMENTED     IN THE LITERATURE TO HAVE     A SENSITIVITY AND NEGATIVE     PREDICTIVE VALUE OF AT LEAST     98 TO 99%.  THE TEST RESULT     SHOULD BE CORRELATED WITH     AN ASSESSMENT OF THE CLINICAL     PROBABILITY OF DVT / VTE.    Radiological Exams on Admission: Dg Chest 2 View  10/19/2012   *RADIOLOGY REPORT*  Clinical Data: Shortness of breath for 1 month.  CHEST - 2 VIEW  Comparison: CT chest 10/05/2012 and PA and lateral chest 09/17/2010.  Findings: Lungs are clear.  Heart size is normal.  No pneumothorax or pleural fluid.  IMPRESSION: No acute disease.   Original Report Authenticated By: Holley Dexter, M.D.     Assessment/Plan Active Problems:   Shortness of breath   Chest pain   Type II or unspecified type diabetes mellitus without mention of complication, not stated as uncontrolled  Chest pain Patient's EKG done today shows T wave inversion in lateral leads, facet of cardiac enzymes is negative Will cycle the cardiac enzymes Obtain 2-D echocardiogram We'll keep her n.p.o. after midnight for possible stress test as per cardiology Will get cardiology consult  Diabetes mellitus Will continue with linear and start sliding scale insulin  Dyspnea Patient was recently seen by pulmonary, and had negative workup. She also had negative CT angiogram done on 10/05/2012. She does have bibasilar crackles on exam BNP is only mildly elevated We'll give 1 dose of Lasix for possible mild fluid overload Start albuterol nebulizers every 2 urine for wheezing We'll obtain 2-D echocardiogram to rule out diastolic dysfunction   will obtain strict I.'s and O.'s    seizure disorder Will continue Dilantin  DVT prophylaxis  Lovenox   Hyperlipidemia Continue Lopid  Code status: full code   Family discussion: discussed with daughter at bedside    Time Spent on Admission: 60 min  [facility] Pager: [phone] 10/19/2012, 8:00 PM  If 7PM-7AM, please contact night-coverage  [email or web address]  Password [email or web address]

## 2012-10-19 NOTE — ED Notes (Signed)
Patient transported to X-ray 

## 2012-10-19 NOTE — ED Provider Notes (Signed)
CSN: [id number]     Arrival date & time 10/19/12  1042 History  This chart was scribed for Dorothy Crease, MD by Bennett Scrape, ED Scribe. This patient was seen in room APA12/APA12 and the patient's care was started at 10:50 AM.   Chief Complaint  Patient presents with  . Shortness of Breath    The history is provided by the patient. No language interpreter was used.    HPI Comments: Dorothy Carey is a 72 y.o. female who presents to the Emergency Department complaining of SOB for the past 2 months with associated intermittent CP. This episode of CP started while she was in bed this morning around 3 AM. She admits to taking two 81 mg ASA with improvement in the pain. She reports having mild CP currently. She was seen by a [facility] 2 weeks ago and has a nuclear test scheduled for tomorrow. She denies any prior episodes of the same. She denies having a h/o pulmonary or cardiac conditions. She denies fever and nausea as associated symptoms. Pt denies smoking and alcohol use.   Past Medical History  Diagnosis Date  . Ruptured disk   . Internal hemorrhoid   . Hematochezia   . Seizures   . Chronic diarrhea   . IBS (irritable bowel syndrome) 10/15/2011  . Diabetes mellitus without complication    Past Surgical History  Procedure Laterality Date  . Colonoscopy  06    ROURK  . Colonoscopy  04    FLEISHMAN  . Breast reduction surgery    . Foot surgery      Bone spurs  . Abdominal hysterectomy    . Appendectomy    . Bladder tac     History reviewed. No pertinent family history. History  Substance Use Topics  . Smoking status: Never Smoker   . Smokeless tobacco: Not on file  . Alcohol Use: No   No OB history provided.  Review of Systems  Constitutional: Negative for fever.  Respiratory: Positive for shortness of breath.   Cardiovascular: Positive for chest pain. Negative for leg swelling.  Gastrointestinal: Negative for nausea.  All other systems reviewed  and are negative.    Allergies  Review of patient's allergies indicates no known allergies.  Home Medications   Current Outpatient Rx  Name  Route  Sig  Dispense  Refill  . acetaminophen (TYLENOL) 500 MG tablet   Oral   Take 500 mg by mouth every 6 (six) hours as needed.         Marland Kitchen aspirin 81 MG tablet   Oral   Take 162 mg by mouth daily.          . Canagliflozin (INVOKANA) 100 MG TABS   Oral   Take by mouth.         . clobetasol cream (TEMOVATE) 0.05 %   Topical   Apply topically 2 (two) times daily.         . fish oil-omega-3 fatty acids 1000 MG capsule   Oral   Take 2 g by mouth 2 (two) times daily.         Marland Kitchen gemfibrozil (LOPID) 600 MG tablet   Oral   Take 600 mg by mouth 2 (two) times daily before a meal.         . hydrochlorothiazide (,MICROZIDE/HYDRODIURIL,) 12.5 MG capsule   Oral   Take 12.5 mg by mouth daily.           . insulin aspart (NOVOLOG) 100  UNIT/ML injection   Subcutaneous   Inject 15 Units into the skin 3 (three) times daily before meals.         . insulin detemir (LEVEMIR) 100 UNIT/ML injection   Subcutaneous   Inject 60 Units into the skin at bedtime.         Marland Kitchen loperamide (IMODIUM) 2 MG capsule   Oral   Take 2 mg by mouth 4 (four) times daily as needed.         Marland Kitchen losartan (COZAAR) 100 MG tablet   Oral   Take 100 mg by mouth daily.           . metFORMIN (GLUCOPHAGE) 1000 MG tablet   Oral   Take 1,000 mg by mouth 2 (two) times daily with a meal.         . pantoprazole (PROTONIX) 40 MG tablet   Oral   Take 40 mg by mouth daily.           . phenytoin (DILANTIN) 100 MG ER capsule   Oral   Take by mouth daily.           . phenytoin (DILANTIN) 30 MG ER capsule   Oral   Take 30 mg by mouth daily.         . phenytoin (DILANTIN) 300 MG ER capsule   Oral   Take 300 mg by mouth 3 (three) times daily.           . pseudoephedrine-acetaminophen (TYLENOL SINUS) 30-500 MG TABS   Oral   Take 1 tablet by  mouth every 4 (four) hours as needed.            Triage Vitals: BP 183/99  Pulse 107  Resp 24  Ht 5\' 3"  (1.6 m)  Wt 180 lb (81.647 kg)  BMI 31.89 kg/m2  SpO2 98%  Physical Exam  Nursing note and vitals reviewed. Constitutional: She is oriented to person, place, and time. She appears well-developed and well-nourished. No distress.  HENT:  Head: Normocephalic and atraumatic.  Right Ear: Hearing normal.  Left Ear: Hearing normal.  Nose: Nose normal.  Mouth/Throat: Oropharynx is clear and moist and mucous membranes are normal.  Eyes: Conjunctivae and EOM are normal. Pupils are equal, round, and reactive to light.  Neck: Normal range of motion. Neck supple.  Cardiovascular: Regular rhythm, S1 normal and S2 normal.  Tachycardia present.  Exam reveals no gallop and no friction rub.   No murmur heard. Pulmonary/Chest: Effort normal and breath sounds normal. No respiratory distress. She exhibits no tenderness.  Musculoskeletal: Normal range of motion.  Neurological: She is alert and oriented to person, place, and time. She has normal strength. No cranial nerve deficit or sensory deficit. Coordination normal. GCS eye subscore is 4. GCS verbal subscore is 5. GCS motor subscore is 6.  Skin: Skin is warm, dry and intact. No rash noted. No cyanosis.  Psychiatric: She has a normal mood and affect. Her speech is normal and behavior is normal. Thought content normal.    ED Course  Procedures (including critical care time)  EKG:  Date: 10/19/2012  Rate: 107  Rhythm: sinus tachycardia  QRS Axis: left  Intervals: normal  ST/T Wave abnormalities: nonspecific ST/T changes  Conduction Disutrbances:nonspecific intraventricular conduction delay  Narrative Interpretation:   Old EKG Reviewed: leftward axis compared to previous, o/w no change    DIAGNOSTIC STUDIES: Oxygen Saturation is 98% on room air, normal by my interpretation.    COORDINATION OF CARE: 11:02 AM-Discussed  treatment plan  which includes CXR, CBC panel, CMP and troponin with pt at bedside and pt agreed to plan.   Labs Review Labs Reviewed  COMPREHENSIVE METABOLIC PANEL - Abnormal; Notable for the following:    Glucose, Bld 195 (*)    Total Protein 8.4 (*)    Alkaline Phosphatase 136 (*)    GFR calc non Af Amer 81 (*)    All other components within normal limits  PRO B NATRIURETIC PEPTIDE - Abnormal; Notable for the following:    Pro B Natriuretic peptide (BNP) 160.7 (*)    All other components within normal limits  D-DIMER, QUANTITATIVE - Abnormal; Notable for the following:    D-Dimer, Quant 0.57 (*)    All other components within normal limits  CBC WITH DIFFERENTIAL  TROPONIN I   Imaging Review Dg Chest 2 View  10/19/2012   *RADIOLOGY REPORT*  Clinical Data: Shortness of breath for 1 month.  CHEST - 2 VIEW  Comparison: CT chest 10/05/2012 and PA and lateral chest 09/17/2010.  Findings: Lungs are clear.  Heart size is normal.  No pneumothorax or pleural fluid.  IMPRESSION: No acute disease.   Original Report Authenticated By: Holley Dexter, M.D.    MDM  Diagnosis: Chest pain  Patient presents to the year for evaluation of difficulty breathing. Patient reports that she has had progressively worsening shortness of breath over the past one to 2 months. She has had workup by our pulmonology including CT angiography to rule out PE and pulmonary function studies. Patient reports no etiology for the shortness of breath has been found, patient scheduled to followup with cardiology for stress test.  Earlier today patient had discomfort in the center of her chest. This is the first time she had any history of chest pain. Patient reports that the symptoms worsened when she exerted herself. Pain is now resolved. If she tries to get up or move, however, she becomes grossly much more short of breath. This is concerning for anginal equivalent.   Initial EKG shows an axis shift to the left compared to previous, no  other changes. Troponin was negative. BNP is not significantly elevated, chest x-ray shows no acute disease. D-dimer is 0.57. This is only slightly elevated and patient has already had CT angiography for the symptoms, showing no evidence of PE.  Followup the patient will require hospitalization for further evaluation of her symptoms and possible cardiac in etiology.    I personally performed the services described in this documentation, which was scribed in my presence. The recorded information has been reviewed and is accurate.   Dorothy Crease, MD 10/19/12 [phone]

## 2012-10-19 NOTE — ED Notes (Signed)
Pt SOB x 2 months, has had chest pain on and off, last chest pain at 3am and pt took 2 81mg  aspirin, pt denies chest pain at this time. Pt is a pt at [facility], will have stress test tomorrow.

## 2012-10-20 ENCOUNTER — Encounter (HOSPITAL_COMMUNITY): Payer: Medicare Other

## 2012-10-20 ENCOUNTER — Telehealth: Payer: Self-pay | Admitting: *Deleted

## 2012-10-20 ENCOUNTER — Encounter: Payer: Self-pay | Admitting: Pulmonary Disease

## 2012-10-20 DIAGNOSIS — E119 Type 2 diabetes mellitus without complications: Secondary | ICD-10-CM | POA: Insufficient documentation

## 2012-10-20 DIAGNOSIS — I10 Essential (primary) hypertension: Secondary | ICD-10-CM | POA: Insufficient documentation

## 2012-10-20 DIAGNOSIS — I517 Cardiomegaly: Secondary | ICD-10-CM

## 2012-10-20 LAB — GLUCOSE, CAPILLARY
Glucose-Capillary: 118 mg/dL — ABNORMAL HIGH (ref 70–99)
Glucose-Capillary: 147 mg/dL — ABNORMAL HIGH (ref 70–99)

## 2012-10-20 LAB — TROPONIN I: Troponin I: <0.3 ng/mL (ref <0.30)

## 2012-10-20 LAB — CBC
Hemoglobin: 12.6 g/dL (ref 12.0–15.0)
MCH: 29.9 pg (ref 26.0–34.0)
RBC: 4.21 MIL/uL (ref 3.87–5.11)

## 2012-10-20 MED ORDER — INSULIN DETEMIR 100 UNIT/ML ~~LOC~~ SOLN
35.0000 [IU] | Freq: Once | SUBCUTANEOUS | Status: AC
  Administered 2012-10-20: 35 [IU] via SUBCUTANEOUS
  Filled 2012-10-20: qty 0.35

## 2012-10-20 MED ORDER — INSULIN DETEMIR 100 UNIT/ML ~~LOC~~ SOLN
SUBCUTANEOUS | Status: AC
  Filled 2012-10-20: qty 1

## 2012-10-20 NOTE — Progress Notes (Signed)
UR chart review completed.  

## 2012-10-20 NOTE — Progress Notes (Signed)
[facility] PROGRESS NOTE  Dorothy Carey WUJ:[id number] DOB: [birth year] DOA: 10/19/2012 PCP: Estanislado Pandy, MD   72 year old female who has been complaining of shortness of breath for the past 2 months and has had pulmonary workup 2 weeks ago. She had a CT angiogram which ruled out pulmonary embolism, and had scheduled for cardiac stress test tomorrow on 10/20/2012. Patient came to the ED as she woke up around 3 AM with chest pain, she took 2 baby aspirin which relieved the pain but came back after some time. The pain was associated with shortness of breath, and became worse on walking.   Assessment/Plan: Chest pain and SOB with Exertion EKG showed T wave inversion in lateral leads cardiac enzymes is negative.  BNP 160.7 D-Dimer minimally elevated.  CT Angio 8/18 was negative for PE. 2-D echocardiogram pending Cardiac stress test to be done 9/3 Appreciate cardiology consultation.  Diabetes mellitus  levemir and start sliding scale insulin  CBGs well controlled.  Dyspnea  Patient was recently seen by pulmonary, and had negative workup.  She also had negative CT angiogram done on 10/05/2012. Neg for PE BNP is only mildly elevated  Given 1 dose of Lasix with good results. Start albuterol nebulizers every 2 urine for wheezing  2-D echocardiogram to rule out diastolic dysfunction - pending will obtain strict I.'s and O.'s   seizure disorder  Dilantin   Hyperlipidemia  Continue Lopid   DVT prophylaxis  Lovenox  Code status: full code  Family discussion: son and 2 other family members at bedside.   Consultants: [facility]   HPI/Subjective: Last chest pain was at 7:00 am this morning.  Non since.  But does get SOB trying to get out of bed.  Objective: Filed Vitals:   10/19/12 1703 10/19/12 1748 10/19/12 2100 10/20/12 0635  BP:  148/54 163/83 134/58  Pulse:  96 80 83  Temp:  99 F (37.2 C) 98.4 F (36.9 C) 97.6 F (36.4 C)  TempSrc:  Axillary Oral Oral   Resp:  18 20 18   Height: 5\' 3"  (1.6 m)     Weight: 79.1 kg (174 lb 6.1 oz)     SpO2:  94% 100% 95%    Intake/Output Summary (Last 24 hours) at 10/20/12 1158 Last data filed at 10/20/12 0800  Gross per 24 hour  Intake    120 ml  Output      0 ml  Net    120 ml   Filed Weights   10/19/12 1054 10/19/12 1703  Weight: 81.647 kg (180 lb) 79.1 kg (174 lb 6.1 oz)    Exam:   General:  A&O, NAD, Lying comfortably in bed.  Family at bedside  Cardiovascular: RRR, no murmurs, rubs or gallops, no lower extremity edema  Respiratory: CTA, no wheeze, crackles, or rales.  No increased work of breathing.  Abdomen: Soft, non-tender, non-distended, + bowel sounds, no masses  Musculoskeletal: Able to move all 4 extremities, 5/5 strength in each  Data Reviewed: Basic Metabolic Panel:  Recent Labs Lab 10/19/12 1124  NA 137  K 5.0  CL 101  CO2 22  GLUCOSE 195*  BUN 20  CREATININE 0.77  CALCIUM 10.1   Liver Function Tests:  Recent Labs Lab 10/19/12 1124  AST 22  ALT 24  ALKPHOS 136*  BILITOT 0.3  PROT 8.4*  ALBUMIN 3.7     CBC:  Recent Labs Lab 10/19/12 1124 10/20/12 0504  WBC 7.7 6.7  NEUTROABS 5.1  --   HGB 13.4  12.6  HCT 39.8 38.2  MCV 89.6 90.7  PLT 260 279   Cardiac Enzymes:  Recent Labs Lab 10/19/12 1124 10/19/12 2023 10/20/12 0738  TROPONINI <0.30 <0.30 <0.30   BNP (last 3 results)  Recent Labs  10/19/12 1124  PROBNP 160.7*   CBG:  Recent Labs Lab 10/19/12 2050 10/20/12 0727  GLUCAP 164* 118*    No results found for this or any previous visit (from the past 240 hour(s)).   Studies: Dg Chest 2 View  10/19/2012   *RADIOLOGY REPORT*  Clinical Data: Shortness of breath for 1 month.  CHEST - 2 VIEW  Comparison: CT chest 10/05/2012 and PA and lateral chest 09/17/2010.  Findings: Lungs are clear.  Heart size is normal.  No pneumothorax or pleural fluid.  IMPRESSION: No acute disease.   Original Report Authenticated By: Holley Dexter,  M.D.    Scheduled Meds: . aspirin EC  81 mg Oral Daily  . enoxaparin (LOVENOX) injection  40 mg Subcutaneous Q24H  . gemfibrozil  600 mg Oral BID AC  . hydrochlorothiazide  12.5 mg Oral Daily  . insulin aspart  0-9 Units Subcutaneous TID WC  . insulin detemir  70 Units Subcutaneous QHS  . loratadine  10 mg Oral Daily  . losartan  100 mg Oral Daily  . pantoprazole  40 mg Oral BID  . phenytoin  400 mg Oral Daily  . sodium chloride  3 mL Intravenous Q12H   Continuous Infusions:   Active Problems:   Shortness of breath   Chest pain   Type II or unspecified type diabetes mellitus without mention of complication, not stated as uncontrolled    [place] [place] [facility] Pager [phone]. If 7PM-7AM, please contact night-coverage at [email or web address], password [facility] 10/20/2012, 11:58 AM  LOS: 1 day

## 2012-10-20 NOTE — Telephone Encounter (Signed)
Message copied by Christen Butter on Tue Oct 20, 2012  4:50 PM ------      Message from: Lupita Leash      Created: Tue Oct 20, 2012  2:34 PM       L,            Please let her know that her PFT's looked OK            Thanks,      B ------

## 2012-10-20 NOTE — Plan of Care (Signed)
Problem: Phase II Progression Outcomes Goal: Stress Test if indicated Outcome: Completed/Met Date Met:  10/20/12 Stress test scheduled for 10/21/2012.

## 2012-10-20 NOTE — Care Management Note (Addendum)
    Page 1 of 1   10/22/2012     9:46:38 AM   CARE MANAGEMENT NOTE 10/22/2012  Patient:  Dorothy Carey, Dorothy Carey   Account Number:  [id number]  Date Initiated:  10/20/2012  Documentation initiated by:  Sharrie Rothman  Subjective/Objective Assessment:   Pt admitted from home with CP. Pt lives alone and is very independent with ADL's. Pts son lives next door and is very active in the care of the pt.     Action/Plan:   No CM needs noted.   Anticipated DC Date:  10/21/2012   Anticipated DC Plan:  HOME/SELF CARE      DC Planning Services  CM consult      Choice offered to / List presented to:             Status of service:  Completed, signed off Medicare Important Message given?   (If response is "NO", the following Medicare IM given date fields will be blank) Date Medicare IM given:   Date Additional Medicare IM given:    Discharge Disposition:  HOME/SELF CARE  Per UR Regulation:    If discussed at Long Length of Stay Meetings, dates discussed:    Comments:  10/22/12 0945 Arlyss Queen, RN BSN CM Pt discharged home today once stress test results available. No CM needs noted.  10/20/12 1100 Arlyss Queen, RN BSN CM

## 2012-10-20 NOTE — Consult Note (Addendum)
Consulting cardiologist: Antoine Poche  Clinical Summary Ms. Maharaj is a 72 y.o.female with a history of HTN, DM, and seizure disorder admitted with DOE and chest pain. She reports that starting 2 months ago, over a 2 day period she developed DOE that has continued and progressed. She states she previously had no exercise limitations. She has no SOB at rest. No orthopnea or PND, reports some occasional LE edema.  She describes episodes of chest pain that started approx 2 months ago as well. She states the pain is and most often occurs at night, pain is 5/10 aching feeling in her left chest that lasts approx 5-10 minutes. No other associated symptoms. Worst with positioning, better after taking ASA.  She was worked up as an outpatient for her symptoms 09/2012, including a CT PE that was negative. An outpatient stress echo was ordered however could not be completed due to underlying LBBB, an outpatient lexsican was ordered and scheduled for 10/20/12, however presented w/ an episode of chest pain last night. EKG shows sinus tach, LBBB (old), 1st degree AV block. She became pain free last night, and has remained so since admission.   No Known Allergies  Medications Scheduled Medications: . aspirin EC  81 mg Oral Daily  . enoxaparin (LOVENOX) injection  40 mg Subcutaneous Q24H  . gemfibrozil  600 mg Oral BID AC  . hydrochlorothiazide  12.5 mg Oral Daily  . insulin aspart  0-9 Units Subcutaneous TID WC  . insulin detemir  70 Units Subcutaneous QHS  . loratadine  10 mg Oral Daily  . losartan  100 mg Oral Daily  . pantoprazole  40 mg Oral BID  . phenytoin  400 mg Oral Daily  . sodium chloride  3 mL Intravenous Q12H     PRN Medications:  sodium chloride, albuterol, HYDROcodone-acetaminophen, sodium chloride  Past Medical History  Diagnosis Date  . Ruptured disk   . Internal hemorrhoid   . Hematochezia   . Seizures   . Chronic diarrhea   . IBS (irritable bowel syndrome) 10/15/2011  .  Diabetes mellitus without complication   . Hypertension     Past Surgical History  Procedure Laterality Date  . Colonoscopy  06    ROURK  . Colonoscopy  04    FLEISHMAN  . Breast reduction surgery    . Foot surgery      Bone spurs  . Abdominal hysterectomy    . Appendectomy    . Bladder tac      History reviewed. No pertinent family history.  Social History Ms. Boman reports that she has never smoked. She does not have any smokeless tobacco history on file. Ms. Nield reports that she does not drink alcohol.  Review of Systems Negative other than reported in HPI.   Physical Examination Blood pressure 134/58, pulse 83, temperature 97.6 F (36.4 C), temperature source Oral, resp. rate 18, height 5\' 3"  (1.6 m), weight 174 lb 6.1 oz (79.1 kg), SpO2 95.00%.  Intake/Output Summary (Last 24 hours) at 10/20/12 1152 Last data filed at 10/20/12 0800  Gross per 24 hour  Intake    120 ml  Output      0 ml  Net    120 ml   Gen: NAD CV: RRR, no m/r/g, no JVD, no carotid bruits Pulm: CTAB Abd: soft, NT, ND Ext: warm, no edema, 2+ bilateral DP/PT pulses Neuro: A&Ox3, no focal deficits.     Lab Results Basic Metabolic Panel:  Recent Labs Lab 10/19/12  1124  NA 137  K 5.0  CL 101  CO2 22  GLUCOSE 195*  BUN 20  CREATININE 0.77  CALCIUM 10.1   Liver Function Tests:  Recent Labs Lab 10/19/12 1124  AST 22  ALT 24  ALKPHOS 136*  BILITOT 0.3  PROT 8.4*  ALBUMIN 3.7   CBC:  Recent Labs Lab 10/19/12 1124 10/20/12 0504  WBC 7.7 6.7  NEUTROABS 5.1  --   HGB 13.4 12.6  HCT 39.8 38.2  MCV 89.6 90.7  PLT 260 279   Cardiac Enzymes:  Recent Labs Lab 10/19/12 1124 10/19/12 2023 10/20/12 0738  TROPONINI <0.30 <0.30 <0.30   BNP: No components found with this basename: POCBNP,    ECG: sinus tach, LBBB, LAE, 1st degree AV block, inferior Q waves    Imaging 9/1/4 *RADIOLOGY REPORT*  Clinical Data: Shortness of breath for 1 month.  CHEST - 2 VIEW    Comparison: CT chest 10/05/2012 and PA and lateral chest  09/17/2010.  Findings: Lungs are clear. Heart size is normal. No pneumothorax  or pleural fluid.  IMPRESSION:  No acute disease.   Impression/ Recommendations 1. Chest pain: patient with multiple risk factors including age, HTN, and DM. Her chest pain by history is atypical in that it is non-exertional mainly occuring at night and is reproducible on exam. 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  - please obtain 2D echocardiogram in setting of DOE and LBBB - please obtain lexiscan cardiac stress test, if echo shows abnormal LVEF will consider cath instead of lexiscan. - please obtain lipid profile  2. DOE: unclear etiology at this time, will follow up results of echo and stress test.       Dina Rich MD

## 2012-10-20 NOTE — Telephone Encounter (Signed)
LMTCB

## 2012-10-20 NOTE — Progress Notes (Signed)
Patient seen and examined. Agree with note as above as per Ms. York.  She continues to have shortness of breath on exertion, she's not had any further chest pain. 2-D echocardiogram has been done with results pending. Plans are for stress test tomorrow. She's had an extensive pulmonary workup. Appreciate cardiology assistance.  MEMON,JEHANZEB

## 2012-10-20 NOTE — Progress Notes (Signed)
*  PRELIMINARY RESULTS* Echocardiogram 2D Echocardiogram has been performed.  Conrad Renner [place] 10/20/2012, 2:48 PM

## 2012-10-21 ENCOUNTER — Observation Stay (HOSPITAL_COMMUNITY): Payer: Medicare Other

## 2012-10-21 ENCOUNTER — Encounter (HOSPITAL_COMMUNITY): Payer: Self-pay

## 2012-10-21 DIAGNOSIS — R9431 Abnormal electrocardiogram [ECG] [EKG]: Secondary | ICD-10-CM

## 2012-10-21 DIAGNOSIS — I10 Essential (primary) hypertension: Secondary | ICD-10-CM

## 2012-10-21 DIAGNOSIS — R079 Chest pain, unspecified: Secondary | ICD-10-CM

## 2012-10-21 DIAGNOSIS — E119 Type 2 diabetes mellitus without complications: Secondary | ICD-10-CM

## 2012-10-21 DIAGNOSIS — E7800 Pure hypercholesterolemia, unspecified: Secondary | ICD-10-CM

## 2012-10-21 DIAGNOSIS — R0602 Shortness of breath: Secondary | ICD-10-CM

## 2012-10-21 LAB — GLUCOSE, CAPILLARY: Glucose-Capillary: 152 mg/dL — ABNORMAL HIGH (ref 70–99)

## 2012-10-21 LAB — LIPID PANEL
LDL Cholesterol: UNDETERMINED mg/dL (ref 0–99)
VLDL: UNDETERMINED mg/dL (ref 0–40)

## 2012-10-21 MED ORDER — SODIUM CHLORIDE 0.9 % IJ SOLN
INTRAMUSCULAR | Status: AC
  Administered 2012-10-21: 10 mL via INTRAVENOUS
  Filled 2012-10-21: qty 10

## 2012-10-21 MED ORDER — TECHNETIUM TC 99M SESTAMIBI - CARDIOLITE
30.0000 | Freq: Once | INTRAVENOUS | Status: AC | PRN
  Administered 2012-10-21: 12:00:00 30 via INTRAVENOUS

## 2012-10-21 MED ORDER — REGADENOSON 0.4 MG/5ML IV SOLN
0.4000 mg | Freq: Once | INTRAVENOUS | Status: DC
  Filled 2012-10-21: qty 5

## 2012-10-21 MED ORDER — TECHNETIUM TC 99M SESTAMIBI - CARDIOLITE
10.0000 | Freq: Once | INTRAVENOUS | Status: AC | PRN
  Administered 2012-10-21: 10:00:00 11 via INTRAVENOUS

## 2012-10-21 MED ORDER — ATORVASTATIN CALCIUM 40 MG PO TABS
40.0000 mg | ORAL_TABLET | Freq: Every day | ORAL | Status: DC
  Administered 2012-10-21: 40 mg via ORAL
  Filled 2012-10-21: qty 2

## 2012-10-21 MED ORDER — REGADENOSON 0.4 MG/5ML IV SOLN
INTRAVENOUS | Status: AC
  Administered 2012-10-21: 0.4 mg via INTRAVENOUS
  Filled 2012-10-21: qty 5

## 2012-10-21 NOTE — Progress Notes (Signed)
The patient was seen and examined, and I agree with the assessment and plan as documented above. Multiple cardiovascular risk factors, and diminishing exercise tolerance over the last 2 months, concerning for ASCVD. Awaiting results of nuclear MPI for further recommendations. Continue current medical therapy, with the addition of atorvastatin.

## 2012-10-21 NOTE — Telephone Encounter (Signed)
LMTCB x2  

## 2012-10-21 NOTE — Progress Notes (Signed)
SUBJECTIVE: Transient throbbing chest pain lasting 5-10 minutes while in bed this am. DOE.  Active Problems:   Shortness of breath   Chest pain   Type II or unspecified type diabetes mellitus without mention of complication, not stated as uncontrolled   LABS: Basic Metabolic Panel:  Recent Labs  52/84/13 1124  NA 137  K 5.0  CL 101  CO2 22  GLUCOSE 195*  BUN 20  CREATININE 0.77  CALCIUM 10.1   Liver Function Tests:  Recent Labs  10/19/12 1124  AST 22  ALT 24  ALKPHOS 136*  BILITOT 0.3  PROT 8.4*  ALBUMIN 3.7   CBC:  Recent Labs  10/19/12 1124 10/20/12 0504  WBC 7.7 6.7  NEUTROABS 5.1  --   HGB 13.4 12.6  HCT 39.8 38.2  MCV 89.6 90.7  PLT 260 279   Cardiac Enzymes:  Recent Labs  10/19/12 1124 10/19/12 2023 10/20/12 0738  TROPONINI <0.30 <0.30 <0.30    D-Dimer:  Recent Labs  10/19/12 1124  DDIMER 0.57*   Fasting Lipid Panel:  Recent Labs  10/21/12 0504  CHOL 229*  HDL 39*  LDLCALC UNABLE TO CALCULATE IF TRIGLYCERIDE OVER 400 mg/dL  TRIG 244*  CHOLHDL 5.9   Echocardiogram: 0/02/270 Left ventricle: The cavity size was normal. Wall thickness was increased in a pattern of mild LVH. Systolic function was normal. The estimated ejection fraction was in the range of 50% to 55%. Although no diagnostic regional wall motion abnormality was identified, this possibility cannot be completely excluded on the basis of this study. The study is not technically sufficient to allow evaluation of LV diastolic function. - Ventricular septum: Septal motion showed dyssynergy, possibly related to conduction abnormality. - Aortic valve: Mildly calcified annulus. Probably trileaflet; mildly calcified leaflets. No significant regurgitation. - Mitral valve: Calcified annulus. Mildly calcified leaflets anterior. Trivial regurgitation. - Left atrium: The atrium was at the upper limits of normal in size. - Tricuspid valve: Trivial regurgitation. -  Pulmonary arteries: Systolic pressure could not be accurately estimated. - Inferior vena cava: Not visualized. Unable to estimate CVP. - Pericardium, extracardiac: There was no pericardial effusion.   RADIOLOGY: Dg Chest 2 View  10/19/2012   *RADIOLOGY REPORT*  Clinical Data: Shortness of breath for 1 month.  CHEST - 2 VIEW  Comparison: CT chest 10/05/2012 and PA and lateral chest 09/17/2010.  Findings: Lungs are clear.  Heart size is normal.  No pneumothorax or pleural fluid.  IMPRESSION: No acute disease.   Original Report Authenticated By: Holley Dexter, M.D.     PHYSICAL EXAM BP 152/77  Pulse 85  Temp(Src) 98.1 F (36.7 C) (Oral)  Resp 16  Ht 5\' 3"  (1.6 m)  Wt 174 lb 6.1 oz (79.1 kg)  BMI 30.9 kg/m2  SpO2 94% General: Well developed, well nourished, obese. in no acute distress Head: Eyes PERRLA, No xanthomas.   Normal cephalic and atramatic  Lungs: Clear bilaterally with mild upper airway crackles, diminished in the bases. Heart: HRRR S1 S2, 1/2 systolic murmur.  .  Pulses are 2+ & equal.            No carotid bruit. No JVD.   Abdomen: Bowel sounds are positive, abdomen soft and non-tender without masses or                  Hernia's noted. Msk:  Back normal, . Normal strength and tone for age. Extremities: No clubbing, cyanosis or edema.  DP +1 Neuro: Alert and oriented X 3.  Psych:  Good affect, responds appropriately  TELEMETRY: Reviewed telemetry pt in: NSR 70's.   ASSESSMENT AND PLAN:  1. Chest Pain: Multiple CVRFs.Typical and atypical characteristics described as throbbing on the left side of the chest. Troponin is negative for ACS. She is scheduled for a lexiscan myoview today. Echo demonstrated EF of 50%-55%. More recommendations post nuclear study. Continue ASA  2. Hypertension: Mildly elevated this am. Continues on HCTZ, she is also on ARB. Creatinine 0.77.   3. Hypercholesterolemia: This am TC of 229, TG of 402, unable to calculate LDL. Will start lipitor  40 mg daily. LFT's are normal.   4. Diabetes: Defer to PTH. Significant CVRF. Good control is essential.        Bettey Mare. Lyman Bishop NP [facility] 10/21/2012, 8:44 AM

## 2012-10-21 NOTE — Progress Notes (Signed)
Stress Lab Nurses Notes - [name]  Dorothy Carey 10/21/2012 Reason for doing test: Chest Pain Type of test: [name] Nurse performing test: Parke Poisson, RN Nuclear Medicine Tech: Lyndel Pleasure Echo Tech: Not Applicable MD performing test: Dr. Purvis Sheffield / [name] NP Family MD: Dr. [name] Test explained and consent signed: yes IV started: 20g jelco, Saline lock flushed, No redness or edema and Saline lock from floor Symptoms: lightheaded Treatment/Intervention: None Reason test stopped: protocol completed After recovery IV was: No redness or edema and Saline Lock flushed Patient to return to Nuc. Med at : Patient discharged: Transported back to room 304 via Wheel chair Patient's Condition upon discharge was: stable Comments: During test BP 108/45 & HR 105.  Recovery BP 108/53 & HR 97.  Symptoms resolved in recovery.  [name] T

## 2012-10-21 NOTE — Progress Notes (Signed)
[facility] PROGRESS NOTE  Dorothy Carey ZOX:[id number] DOB: 01/07/1941 DOA: 10/19/2012 PCP: Estanislado Pandy, MD  Assessment/Plan: 1. Chest pain: Currently resolved. Troponin is negative. Multiple risk factors including age, hypertension and diabetes mellitus. Nuclear stress test results pending. Further recommendations per cardiology. 2. Dyspnea on exertion: Negative pulmonology evaluation as an outpatient, negative CT as an outpatient. PFTs looked fine per pulmonology. 3. DM type 2: Capillary blood sugars stable. 4. Hyperlipidemia: Statin started. Discontinue Lopid.   Followup stress testing results and final cardiology recommendations  Code Status: Full code DVT prophylaxis: Lovenox Family Communication: Discussed with son at bedside Disposition Plan: Home when ready  Brendia Sacks, MD  [facility]  Pager [phone] If 7PM-7AM, please contact night-coverage at [email or web address], password [facility] 10/21/2012, 3:05 PM  LOS: 2 days   Summary: 72 year old female who has been complaining of shortness of breath for the past 2 months and has had pulmonary workup 2 weeks ago. She had a CT angiogram which ruled out pulmonary embolism, and had scheduled for cardiac stress test tomorrow on 10/20/2012. Patient came to the ED as she woke up around 3 AM this morning and chest pain, she took 2 baby aspirin which relieved the pain but came back after some time.  Consultants:  Cardiology  Procedures:  2-D echocardiogram: Left ventricular ejection fraction 50-55%. Wall motion abnormalities could not be completely excluded, septum showed dyssynergy. Diastolic function could not be evaluated.  HPI/Subjective: No chest pain or shortness of breath right now.  Objective: Filed Vitals:   10/20/12 1433 10/20/12 2100 10/21/12 0603 10/21/12 1428  BP: 154/81 128/86 152/77 148/76  Pulse: 90 84 85 85  Temp: 98.2 F (36.8 C) 98.4 F (36.9 C) 98.1 F (36.7 C) 98.6 F (37 C)  TempSrc: Oral Oral Oral  Oral  Resp: 20 19 16 16   Height:      Weight:      SpO2: 93% 95% 94% 96%    Intake/Output Summary (Last 24 hours) at 10/21/12 1505 Last data filed at 10/20/12 1756  Gross per 24 hour  Intake    120 ml  Output      0 ml  Net    120 ml     Filed Weights   10/19/12 1054 10/19/12 1703  Weight: 81.647 kg (180 lb) 79.1 kg (174 lb 6.1 oz)    Exam:   Afebrile, vital signs stable.  General: Appears calm and comfortable. Speech fluent and clear.  Psychiatric: Grossly normal mood and affect.  Cardiovascular: Regular rate and rhythm. No murmur, rub, gallop. No lower extremity edema.  Respiratory: Clear to auscultation bilaterally. No wheezes, rales, rhonchi. Normal respiratory effort.  Data Reviewed:  Capillary blood sugars stable  Scheduled Meds: . aspirin EC  81 mg Oral Daily  . atorvastatin  40 mg Oral q1800  . enoxaparin (LOVENOX) injection  40 mg Subcutaneous Q24H  . hydrochlorothiazide  12.5 mg Oral Daily  . insulin aspart  0-9 Units Subcutaneous TID WC  . insulin detemir  70 Units Subcutaneous QHS  . loratadine  10 mg Oral Daily  . losartan  100 mg Oral Daily  . pantoprazole  40 mg Oral BID  . phenytoin  400 mg Oral Daily  . regadenoson  0.4 mg Intravenous Once  . sodium chloride  3 mL Intravenous Q12H   Continuous Infusions:   Active Problems:   Shortness of breath   Chest pain   Type II or unspecified type diabetes mellitus without mention of complication, not stated as uncontrolled  Time spent 25 minutes

## 2012-10-22 LAB — GLUCOSE, CAPILLARY: Glucose-Capillary: 125 mg/dL — ABNORMAL HIGH (ref 70–99)

## 2012-10-22 MED ORDER — ATORVASTATIN CALCIUM 40 MG PO TABS: 40.0000 mg | ORAL_TABLET | Freq: Every day | ORAL | Status: DC

## 2012-10-22 NOTE — Discharge Summary (Signed)
Physician Discharge Summary  Dorothy Carey WGN:[id number] DOB: [birth year] DOA: 10/19/2012  PCP: Estanislado Pandy, MD  Admit date: 10/19/2012 Discharge date: 10/22/2012  Time spent: 45 minutes  Recommendations for Outpatient Follow-up:  1. PCP please check LFTs, and fasting lipid panel in one month as patient was recently started on lipitor. 2. Cardiology follow up is scheduled. 3.   PCP follow up for cholesterol, DM, HTN management.  Discharge Diagnoses:  Active Problems:   Shortness of breath   Chest pain   Type II or unspecified type diabetes mellitus without mention of complication, not stated as uncontrolled   Discharge Condition: stable.  Diet recommendation: heart healthy  Filed Weights   10/19/12 1054 10/19/12 1703  Weight: 81.647 kg (180 lb) 79.1 kg (174 lb 6.1 oz)    History of present illness:  72 year old female who has been complaining of shortness of breath for the past 2 months and has had pulmonary workup 2 weeks ago. She had a CT angiogram which ruled out pulmonary embolism, and had scheduled for cardiac stress test tomorrow on 10/20/2012. Patient came to the ED as she woke up around 3 AM this morning and chest pain, she took 2 baby aspirin which relieved the pain but came back after some time. The pain was associated with shortness of breath, and became worse on walking. At this time patient is chest pain-free, she denies shortness of breath. Patient has mildly elevated d-dimer, and had recent CT angiogram which ruled out pulmonary embolism. She denies fever, no nausea vomiting or diarrhea, no cough   Hospital Course:   Chest pain:  Currently resolved.  Troponin is negative.  NM Stress test mentioned:  small mild fixed defect  seen in the mid inferolateral and mid inferoapical walls Multiple risk factors including age, hypertension and diabetes mellitus.  Continue Aspirin Patient stable for outpatient follow up with cardiology.  Dyspnea on exertion:  Negative  pulmonology evaluation as an outpatient,  negative CT as an outpatient.  PFTs looked fine per pulmonology.   DM type 2:  Capillary blood sugars stable.  PCP follow up.  Hyperlipidemia:  Statin started. Discontinue Lopid. Check LFTs in 1 month.  Procedures:  2-D echocardiogram: Left ventricular ejection fraction 50-55%. Wall motion abnormalities could not be completely excluded, septum showed dyssynergy. Diastolic function could not be evaluated.  NM Stress test:  Results listed below.   Consultations:  Cardiology  Discharge Exam: Filed Vitals:   10/22/12 0509  BP: 153/83  Pulse: 80  Temp: 98.1 F (36.7 C)  Resp: 16     General: WD, WN, Overweight, Appears calm and comfortable.  Cardiovascular: Regular rate and rhythm. No murmur, rub, gallop. No lower extremity edema. Respiratory: Clear to auscultation bilaterally. No wheezes, rales, rhonchi. Normal respiratory effort. Abdomen:  Soft, NT, ND, +BS, no masses Extremities:  5/5 strength in each.     Discharge Instructions      Discharge Orders   Future Appointments Provider Department Dept Phone   10/26/2012 9:45 AM Lupita Leash, MD [facility] [phone]   11/10/2012 1:00 PM Jodelle Gross, NP [facility] [phone]   12/14/2012 2:30 PM Malissa Hippo, MD [facility] [phone]   Future Orders Complete By Expires   Diet - low sodium heart healthy  As directed    Increase activity slowly  As directed        Medication List    STOP taking these medications  gemfibrozil 600 MG tablet  Commonly known as:  LOPID      TAKE these medications       aspirin EC 81 MG tablet  Take 81 mg by mouth daily.     atorvastatin 40 MG tablet  Commonly known as:  LIPITOR  Take 1 tablet (40 mg total) by mouth daily at 6 PM.     clobetasol cream 0.05 %  Commonly known as:  TEMOVATE  Apply topically 2 (two) times daily.     HUMALOG KWIKPEN 100  UNIT/ML Sopn  Generic drug:  insulin lispro  Inject 20 Units into the skin 3 (three) times daily with meals. Uses sliding scale: 90-150=20, 151-200=21, 201-250=22, 251-300=23, 301-350=24, 351-400=25, 400 or more=26.     hydrochlorothiazide 12.5 MG capsule  Commonly known as:  MICROZIDE  Take 12.5 mg by mouth daily.     ibuprofen 100 MG tablet  Commonly known as:  ADVIL,MOTRIN  Take 200 mg by mouth every 6 (six) hours as needed for fever.     LEVEMIR FLEXPEN 100 UNIT/ML Sopn  Generic drug:  Insulin Detemir  Inject 70 Units into the skin at bedtime.     loperamide 2 MG capsule  Commonly known as:  IMODIUM  Take 2 mg by mouth 4 (four) times daily as needed for diarrhea or loose stools.     loratadine 10 MG tablet  Commonly known as:  CLARITIN  Take 10 mg by mouth daily.     losartan 100 MG tablet  Commonly known as:  COZAAR  Take 100 mg by mouth daily.     pantoprazole 40 MG tablet  Commonly known as:  PROTONIX  Take 40 mg by mouth 2 (two) times daily.     phenytoin 100 MG ER capsule  Commonly known as:  DILANTIN  Take 400 mg by mouth daily.       No Known Allergies Follow-up Information   Follow up with [name], NP On 11/10/2012. (1 pm)    Specialty:  Nurse Practitioner   Contact information:   [address] [place] [address] [phone]       Follow up with Estanislado Pandy, MD. Schedule an appointment as soon as possible for a visit in 2 weeks.   Specialty:  Cardiology   Contact information:   [address] [place] [address] [phone]        The results of significant diagnostics from this hospitalization (including imaging, microbiology, ancillary and laboratory) are listed below for reference.    Significant Diagnostic Studies: Dg Chest 2 View  10/19/2012   *RADIOLOGY REPORT*  Clinical Data: Shortness of breath for 1 month.  CHEST - 2 VIEW  Comparison: CT chest 10/05/2012 and PA and lateral chest 09/17/2010.  Findings: Lungs are clear.   Heart size is normal.  No pneumothorax or pleural fluid.  IMPRESSION: No acute disease.   Original Report Authenticated By: Holley Dexter, M.D.   Ct Angio Chest W/cm &/or Wo Cm  10/05/2012   *RADIOLOGY REPORT*  Clinical Data: Short of breath and chest pain  CT ANGIOGRAPHY CHEST  Technique:  Multidetector CT imaging of the chest using the standard protocol during bolus administration of intravenous contrast. Multiplanar reconstructed images including MIPs were obtained and reviewed to evaluate the vascular anatomy.  Contrast: 80mL OMNIPAQUE IOHEXOL 350 MG/ML SOLN  Comparison: 09/15/2010  Findings: There are no filling defects in the pulmonary arterial tree to suggest acute pulmonary thromboembolism.  No evidence of aortic dissection or aneurysm.  Minimal circumflex and left anterior descending coronary artery calcification.  Negative pericardial effusion or abnormal adenopathy.  No pneumothorax or pleural effusion.  Minimal dependent ground-glass is likely related to volume loss. No consolidation or mass.  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  Small hiatal hernia is stable.  IMPRESSION: No evidence of acute pulmonary thromboembolism.  Chronic changes are noted.   Original Report Authenticated By: Jolaine Click, M.D.   Nm Myocar Single W/spect W/wall Motion And Ef  10/21/2012   Ordering Physician: [name]  Reading Physician: Prentice Docker  Clinical Data: This is a 72 year old woman who has traditional risk factors for coronary artery disease and is experiencing progressive chest pain and dyspnea on exertion, and is referred for an ischemic evaluation.  NUCLEAR MEDICINE STRESS MYOVIEW STUDY WITH SPECT AND LEFT VENTRICULAR EJECTION FRACTION  Radionuclide Data: One-day rest/stress protocol performed with 10/30 mCi of Tc-48m Myoview.  Stress Data: The patient was stressed according to the Lexiscan protocol for 4 minutes and 57 seconds.  The resting heart rate of 82 beats per minute rose to a  maximal heart rate of 105 beats per minute.  This value represents 71% of the maximal, age predicted heart rate.  The resting blood pressure 135/65 mmHg rose to a maximum blood pressure 135/65 mmHg.  The stress test was stopped due to completion of the protocol.  EKG: The  resting ECG showed normal sinus rhythm with a left bundle branch block with no significant changes seen with stress.  There are no arrhythmias noted.  Scintigraphic Data: Tomographic views were obtained using the short axis, vertical long axis, and horizontal long axis planes.  The stress images showed essentially normal homogeneous uptake of the mild cardial radioactive tracer in all wall segments, with the exception of a very small and mild fixed defect seen in the mid inferolateral and mid inferoapical walls.   Resting images were notably worse.  There are increased GI counts which appear to be stealing accounts from the myocardium.  While the calculated ejection fraction was 49%, gross left ventricular systolic function appears be normal with an approximated left ventricular ejection fraction of 60%, with no wall motion abnormalities noted.  IMPRESSION: 1.  Probably normal left ventricular myocardial perfusion. 2.  There is a small mild fixed defect  seen in the mid inferolateral and mid inferoapical walls, which appears to be due to artifact from increased GI counts which appeared to be stealing counts from the myocardium.  3. Calculated LVEF is 49%, but LV systolic function appears to be grossly normal, with an estimated LVEF fo 60%. No wall motion abnormalities.   Original Report Authenticated By: Prentice Docker     Labs: Basic Metabolic Panel:  Recent Labs Lab 10/19/12 1124  NA 137  K 5.0  CL 101  CO2 22  GLUCOSE 195*  BUN 20  CREATININE 0.77  CALCIUM 10.1   Liver Function Tests:  Recent Labs Lab 10/19/12 1124  AST 22  ALT 24  ALKPHOS 136*  BILITOT 0.3  PROT 8.4*  ALBUMIN 3.7   CBC:  Recent Labs Lab  10/19/12 1124 10/20/12 0504  WBC 7.7 6.7  NEUTROABS 5.1  --   HGB 13.4 12.6  HCT 39.8 38.2  MCV 89.6 90.7  PLT 260 279   Cardiac Enzymes:  Recent Labs Lab 10/19/12 1124 10/19/12 2023 10/20/12 0738  TROPONINI <0.30 <0.30 <0.30   BNP: BNP (last 3 results)  Recent Labs  10/19/12 1124  PROBNP 160.7*   CBG:  Recent Labs Lab 10/21/12 0736 10/21/12 1114 10/21/12 1624 10/21/12 2130 10/22/12 0718  GLUCAP 152* 133* 301* 219* 125*       Signed:  Conley Canal  [facility] 10/22/2012, 9:59 AM

## 2012-10-22 NOTE — Progress Notes (Signed)
IV removed, site WNL.  Pt given d/c instructions and new prescriptions.  Discussed home care with patient and discussed home medications, patient verbalizes understanding, teachback completed. F/U appointment in place, pt states they will keep appointment. Pt is stable at this time. Pt taken to main entrance by staff, refused wheelchair.

## 2012-10-22 NOTE — Discharge Summary (Signed)
Patient seen, independently examined and chart reviewed. I agree with exam, assessment and plan discussed with Algis Downs, PA-C.  Patient feels well. She has had no recurrent chest pain. Nuclear medicine test was felt to be normal. She is stable for discharge home with outpatient followup.  Brendia Sacks, MD [facility] [phone]

## 2012-10-22 NOTE — Progress Notes (Signed)
Inpatient Diabetes Program Recommendations  AACE/ADA: New Consensus Statement on Inpatient Glycemic Control (2013)  Target Ranges:  Prepandial:   less than 140 mg/dL      Peak postprandial:   less than 180 mg/dL (1-2 hours)      Critically ill patients:  140 - 180 mg/dL   Results for Dorothy Carey, Dorothy Carey (MRN [id number]) as of 10/22/2012 08:19  Ref. Range 10/21/2012 07:36 10/21/2012 11:14 10/21/2012 16:24 10/21/2012 21:30 10/22/2012 07:18  Glucose-Capillary Latest Range: 70-99 mg/dL 409 (H) 811 (H) 914 (H) 219 (H) 125 (H)    Inpatient Diabetes Program Recommendations Insulin - Meal Coverage: Please consider ordering Novolog 4 units TID with meals.  Note: Noted patient was NPO until yesterday at 13:30 at which time a Heart Healthy diet was ordered.  Added Carb Modified Diabetic to current Heart Healthy diet order this morning.  Postprandial glucose consistently elevated yesterday after starting diet.  Fasting glucose over the past two mornings have been 152 mg/dl on 9/3 and 782 mg/dl this morning.  Please consider ordering Novolog 4 units TID with meals as long as the patient eats at least 50% of meals.  Will continue to follow.  Thanks, Orlando Penner, RN, MSN, CCRN Diabetes Coordinator Inpatient Diabetes Program [phone]

## 2012-10-22 NOTE — Progress Notes (Signed)
UR chart review completed.  

## 2012-10-23 NOTE — Telephone Encounter (Signed)
ATC line busy x 3 wcb 

## 2012-10-26 ENCOUNTER — Ambulatory Visit: Payer: Medicare Other | Admitting: Pulmonary Disease

## 2012-11-10 ENCOUNTER — Encounter: Payer: Self-pay | Admitting: Adult Health

## 2012-11-10 ENCOUNTER — Ambulatory Visit (INDEPENDENT_AMBULATORY_CARE_PROVIDER_SITE_OTHER): Payer: Medicare Other | Admitting: Adult Health

## 2012-11-10 VITALS — BP 159/83 | HR 93 | Ht 63.0 in | Wt 175.0 lb

## 2012-11-10 DIAGNOSIS — E7800 Pure hypercholesterolemia, unspecified: Secondary | ICD-10-CM | POA: Insufficient documentation

## 2012-11-10 DIAGNOSIS — I10 Essential (primary) hypertension: Secondary | ICD-10-CM

## 2012-11-10 MED ORDER — PRAVASTATIN SODIUM 20 MG PO TABS: 20.0000 mg | ORAL_TABLET | Freq: Every evening | ORAL | Status: DC

## 2012-11-10 NOTE — Patient Instructions (Addendum)
Your physician recommends that you schedule a follow-up appointment in: 6 months You will receive a reminder letter two months in advance reminding you to call and schedule your appointment. If you don't receive this letter, please contact our office.   Your physician has recommended you make the following change in your medication:  1. Start Pravastatin 20 mg daily.

## 2012-11-10 NOTE — Assessment & Plan Note (Signed)
BP is slightly elevated today but usually better at home.  I have broached the idea of a sleep study as she is very tired during the day, hypertensive and DOE.  She wishes to think about it and discuss with PCP. Continue current medications.

## 2012-11-10 NOTE — Progress Notes (Signed)
HPI: Dorothy Carey is a 72 year old patient of Dr. branch with known history of hypertension, diabetes, and seizure disorder, who was recently admitted to [facility] for recurrent chest pain and dyspnea on exertion. She was ruled out for MI, PE, and had a nuclear medicine stress test which was negative for ischemia the imaging or EKG. EF is 49%. Patient was continued on current medication regimen and asked her to hear to a diabetic diet. Intolerant of lipitor causing myalgias. Continues to feel fatigued and easily short of breath.      No Known Allergies  Current Outpatient Prescriptions  Medication Sig Dispense Refill  . aspirin EC 81 MG tablet Take 81 mg by mouth daily.      . clobetasol cream (TEMOVATE) 0.05 % Apply topically 2 (two) times daily.      Marland Kitchen gemfibrozil (LOPID) 600 MG tablet       . hydrochlorothiazide (,MICROZIDE/HYDRODIURIL,) 12.5 MG capsule Take 12.5 mg by mouth daily.        Marland Kitchen ibuprofen (ADVIL,MOTRIN) 100 MG tablet Take 200 mg by mouth every 6 (six) hours as needed for fever.      . Insulin Detemir (LEVEMIR FLEXPEN) 100 UNIT/ML SOPN Inject 70 Units into the skin at bedtime.      . insulin lispro (HUMALOG KWIKPEN) 100 UNIT/ML SOPN Inject 20 Units into the skin 3 (three) times daily with meals. Uses sliding scale: 90-150=20, 151-200=21, 201-250=22, 251-300=23, 301-350=24, 351-400=25, 400 or more=26.      Marland Kitchen loperamide (IMODIUM) 2 MG capsule Take 2 mg by mouth 4 (four) times daily as needed for diarrhea or loose stools.       Marland Kitchen loratadine (CLARITIN) 10 MG tablet Take 10 mg by mouth daily.      Marland Kitchen losartan (COZAAR) 100 MG tablet Take 100 mg by mouth daily.        . pantoprazole (PROTONIX) 40 MG tablet Take 40 mg by mouth 2 (two) times daily.       . phenytoin (DILANTIN) 100 MG ER capsule Take 400 mg by mouth daily.        No current facility-administered medications for this visit.    Past Medical History  Diagnosis Date  . Ruptured disk   . Internal hemorrhoid   .  Hematochezia   . Seizures   . Chronic diarrhea   . IBS (irritable bowel syndrome) 10/15/2011  . Diabetes mellitus without complication   . Hypertension     Past Surgical History  Procedure Laterality Date  . Colonoscopy  06    ROURK  . Colonoscopy  04    FLEISHMAN  . Breast reduction surgery    . Foot surgery      Bone spurs  . Abdominal hysterectomy    . Appendectomy    . Bladder tac      ZOX:WRUEAV of systems complete and found to be negative unless listed above  PHYSICAL EXAM BP 159/83  Pulse 93  Ht 5\' 3"  (1.6 m)  Wt 175 lb (79.379 kg)  BMI 31.01 kg/m2 General: Well developed, well nourished, in no acute distress Head: Eyes PERRLA, No xanthomas.   Normal cephalic and atramatic  Lungs: Clear bilaterally with some bibasilar crackles noted. No wheezes. Heart: HRRR S1 S2, without MRG.  Pulses are 2+ & equal.            No carotid bruit. No JVD.  No abdominal bruits. No femoral bruits. Abdomen: Bowel sounds are positive, abdomen soft and non-tender without masses or  Hernia's noted. Msk:  Back normal, normal gait. Normal strength and tone for age. Extremities: No clubbing, cyanosis or edema.  DP +1 Neuro: Alert and oriented X 3. Psych:  Good affect, responds appropriately    ASSESSMENT AND PLAN

## 2012-11-10 NOTE — Progress Notes (Deleted)
Name: Dorothy Carey    DOB: [birth year]  Age: 72 y.o.  MR#: [id number]       PCP:  Estanislado Pandy, MD      Insurance: Payor: [employer] [place] [employer] / Plan: BLUE MEDICARE / Product Type: *No Product type* /   CC:    Chief Complaint  Patient presents with  . Shortness of Breath  . Hypertension    VS Filed Vitals:   11/10/12 1310  BP: 159/83  Pulse: 93  Height: 5\' 3"  (1.6 m)  Weight: 175 lb (79.379 kg)    Weights Current Weight  11/10/12 175 lb (79.379 kg)  10/19/12 174 lb 6.1 oz (79.1 kg)  10/05/12 177 lb (80.287 kg)    Blood Pressure  BP Readings from Last 3 Encounters:  11/10/12 159/83  10/22/12 153/83  10/05/12 140/72     Admit date:  (Not on file) Last encounter with RMR:  Visit date not found   Allergy Review of patient's allergies indicates no known allergies.  Current Outpatient Prescriptions  Medication Sig Dispense Refill  . aspirin EC 81 MG tablet Take 81 mg by mouth daily.      Marland Kitchen atorvastatin (LIPITOR) 40 MG tablet Take 1 tablet (40 mg total) by mouth daily at 6 PM.  30 tablet  3  . clobetasol cream (TEMOVATE) 0.05 % Apply topically 2 (two) times daily.      Marland Kitchen gemfibrozil (LOPID) 600 MG tablet       . hydrochlorothiazide (,MICROZIDE/HYDRODIURIL,) 12.5 MG capsule Take 12.5 mg by mouth daily.        Marland Kitchen ibuprofen (ADVIL,MOTRIN) 100 MG tablet Take 200 mg by mouth every 6 (six) hours as needed for fever.      . Insulin Detemir (LEVEMIR FLEXPEN) 100 UNIT/ML SOPN Inject 70 Units into the skin at bedtime.      . insulin lispro (HUMALOG KWIKPEN) 100 UNIT/ML SOPN Inject 20 Units into the skin 3 (three) times daily with meals. Uses sliding scale: 90-150=20, 151-200=21, 201-250=22, 251-300=23, 301-350=24, 351-400=25, 400 or more=26.      Marland Kitchen loperamide (IMODIUM) 2 MG capsule Take 2 mg by mouth 4 (four) times daily as needed for diarrhea or loose stools.       Marland Kitchen loratadine (CLARITIN) 10 MG tablet Take 10 mg by mouth daily.      Marland Kitchen losartan (COZAAR) 100 MG tablet  Take 100 mg by mouth daily.        . pantoprazole (PROTONIX) 40 MG tablet Take 40 mg by mouth 2 (two) times daily.       . phenytoin (DILANTIN) 100 MG ER capsule Take 400 mg by mouth daily.        No current facility-administered medications for this visit.    Discontinued Meds:   There are no discontinued medications.  Patient Active Problem List   Diagnosis Date Noted  . Diabetes mellitus without complication   . Hypertension   . Type II or unspecified type diabetes mellitus without mention of complication, not stated as uncontrolled 10/19/2012  . Shortness of breath 10/05/2012  . Chest pain 10/05/2012  . IBS (irritable bowel syndrome) 10/15/2011  . Diarrhea 10/15/2011    LABS    Component Value Date/Time   NA 137 10/19/2012 1124   NA 138 10/05/2012 1047   NA 138 11/30/2007   NA 140 03/27/2006 0947   K 5.0 10/19/2012 1124   K 4.7 10/05/2012 1047   K 5.5* 11/30/2007   CL 101 10/19/2012 1124  CL 102 10/05/2012 1047   CL 103 03/27/2006 0947   CO2 22 10/19/2012 1124   CO2 25 10/05/2012 1047   CO2 29 03/27/2006 0947   GLUCOSE 195* 10/19/2012 1124   GLUCOSE 216* 10/05/2012 1047   GLUCOSE 133* 03/27/2006 0947   BUN 20 10/19/2012 1124   BUN 24* 10/05/2012 1047   BUN 12 03/27/2006 0947   CREATININE 0.77 10/19/2012 1124   CREATININE 0.9 10/05/2012 1047   CREATININE 0.7 11/30/2007   CREATININE 0.5 03/27/2006 0947   CALCIUM 10.1 10/19/2012 1124   CALCIUM 9.3 10/05/2012 1047   CALCIUM 9.5 03/27/2006 0947   GFRNONAA 81* 10/19/2012 1124   GFRNONAA 131 03/27/2006 0947   GFRAA >90 10/19/2012 1124   GFRAA 159 03/27/2006 0947   CMP     Component Value Date/Time   NA 137 10/19/2012 1124   NA 138 11/30/2007   K 5.0 10/19/2012 1124   CL 101 10/19/2012 1124   CO2 22 10/19/2012 1124   GLUCOSE 195* 10/19/2012 1124   BUN 20 10/19/2012 1124   CREATININE 0.77 10/19/2012 1124   CREATININE 0.7 11/30/2007   CALCIUM 10.1 10/19/2012 1124   PROT 8.4* 10/19/2012 1124   ALBUMIN 3.7 10/19/2012 1124   AST 22 10/19/2012 1124   ALT 24 10/19/2012 1124    ALKPHOS 136* 10/19/2012 1124   BILITOT 0.3 10/19/2012 1124   GFRNONAA 81* 10/19/2012 1124   GFRAA >90 10/19/2012 1124       Component Value Date/Time   WBC 6.7 10/20/2012 0504   WBC 7.7 10/19/2012 1124   WBC 6.7 10/05/2012 1047   HGB 12.6 10/20/2012 0504   HGB 13.4 10/19/2012 1124   HGB 12.8 10/05/2012 1047   HCT 38.2 10/20/2012 0504   HCT 39.8 10/19/2012 1124   HCT 37.3 10/05/2012 1047   MCV 90.7 10/20/2012 0504   MCV 89.6 10/19/2012 1124   MCV 88.6 10/05/2012 1047    Lipid Panel     Component Value Date/Time   CHOL 229* 10/21/2012 0504   TRIG 402* 10/21/2012 0504   HDL 39* 10/21/2012 0504   CHOLHDL 5.9 10/21/2012 0504   VLDL UNABLE TO CALCULATE IF TRIGLYCERIDE OVER 400 mg/dL 07/24/4401 4742   LDLCALC UNABLE TO CALCULATE IF TRIGLYCERIDE OVER 400 mg/dL 06/26/5636 7564    ABG No results found for this basename: phart, pco2, pco2art, po2, po2art, hco3, tco2, acidbasedef, o2sat     No results found for this basename: TSH   BNP (last 3 results)  Recent Labs  10/19/12 1124  PROBNP 160.7*   Cardiac Panel (last 3 results) No results found for this basename: CKTOTAL, CKMB, TROPONINI, RELINDX,  in the last 72 hours  Iron/TIBC/Ferritin No results found for this basename: iron, tibc, ferritin     EKG Orders placed during the hospital encounter of 10/19/12  . ED EKG  . ED EKG  . EKG  . EKG     Prior Assessment and Plan Problem List as of 11/10/2012     Cardiovascular and Mediastinum   Hypertension     Digestive   IBS (irritable bowel syndrome)     Endocrine   Type II or unspecified type diabetes mellitus without mention of complication, not stated as uncontrolled   Diabetes mellitus without complication     Other   Diarrhea   Shortness of breath   Last Assessment & Plan   10/05/2012 Office Visit Edited 10/05/2012 11:37 AM by Lupita Leash, MD     I am primarily worried about 2  major issues here in this diabetic female with shortness of breath. One concern about pulmonary embolism as the  shortness of breath started abruptly after a period of immobility with leg pain.I am also concerned about possible coronary disease given the left-sided chest pain which has been associated with the shortness of breath in the last several weeks. She has also had a significant family history of coronary artery disease. At this point, she needs an extensive workup for her shortness of breath and we will start with a CT angiogram for chest look for pulmonary embolism today.I am encouraged by the fact that her office oximetry was normal with walking today.  She has some crackles on lung auscultation today but her exam is otherwise not consistent with volume overload.  Plan: -CBC, bmet now -full PFT's -CT angiogram chest to look for pulmonary embolism today -Stress echo -Followup 2 weeks    Chest pain       Imaging: Dg Chest 2 View  10/19/2012   *RADIOLOGY REPORT*  Clinical Data: Shortness of breath for 1 month.  CHEST - 2 VIEW  Comparison: CT chest 10/05/2012 and PA and lateral chest 09/17/2010.  Findings: Lungs are clear.  Heart size is normal.  No pneumothorax or pleural fluid.  IMPRESSION: No acute disease.   Original Report Authenticated By:  Dexter, M.D.   Nm Myocar Single W/spect W/wall Motion And Ef  10/21/2012   Ordering Physician: [name]  Reading Physician: Prentice Docker  Clinical Data: This is a 72 year old woman who has traditional risk factors for coronary artery disease and is experiencing progressive chest pain and dyspnea on exertion, and is referred for an ischemic evaluation.  NUCLEAR MEDICINE STRESS MYOVIEW STUDY WITH SPECT AND LEFT VENTRICULAR EJECTION FRACTION  Radionuclide Data: One-day rest/stress protocol performed with 10/30 mCi of Tc-34m Myoview.  Stress Data: The patient was stressed according to the Lexiscan protocol for 4 minutes and 57 seconds.  The resting heart rate of 82 beats per minute rose to a maximal heart rate of 105 beats per minute.  This  value represents 71% of the maximal, age predicted heart rate.  The resting blood pressure 135/65 mmHg rose to a maximum blood pressure 135/65 mmHg.  The stress test was stopped due to completion of the protocol.  EKG: The  resting ECG showed normal sinus rhythm with a left bundle branch block with no significant changes seen with stress.  There are no arrhythmias noted.  Scintigraphic Data: Tomographic views were obtained using the short axis, vertical long axis, and horizontal long axis planes.  The stress images showed essentially normal homogeneous uptake of the mild cardial radioactive tracer in all wall segments, with the exception of a very small and mild fixed defect seen in the mid inferolateral and mid inferoapical walls.   Resting images were notably worse.  There are increased GI counts which appear to be stealing accounts from the myocardium.  While the calculated ejection fraction was 49%, gross left ventricular systolic function appears be normal with an approximated left ventricular ejection fraction of 60%, with no wall motion abnormalities noted.  IMPRESSION: 1.  Probably normal left ventricular myocardial perfusion. 2.  There is a small mild fixed defect  seen in the mid inferolateral and mid inferoapical walls, which appears to be due to artifact from increased GI counts which appeared to be stealing counts from the myocardium.  3. Calculated LVEF is 49%, but LV systolic function appears to be grossly normal, with an estimated LVEF  fo 60%. No wall motion abnormalities.   Original Report Authenticated By: Prentice Docker

## 2012-11-10 NOTE — Assessment & Plan Note (Addendum)
She is intolerant to crestor and lipitor due to myalgias. I have prescribed pravachol 20mg  daily. If she has symptoms she is to stop the medication and we will add this to medication intolerance list. She is due to see Dr. Fransico Him in a couple of months for labs. Will defer to him for follow up labs.

## 2012-11-17 NOTE — Telephone Encounter (Signed)
I spoke with patient about results and she verbalized understanding and had no questions 

## 2012-12-14 ENCOUNTER — Other Ambulatory Visit (INDEPENDENT_AMBULATORY_CARE_PROVIDER_SITE_OTHER): Payer: Self-pay | Admitting: *Deleted

## 2012-12-14 ENCOUNTER — Encounter (INDEPENDENT_AMBULATORY_CARE_PROVIDER_SITE_OTHER): Payer: Self-pay | Admitting: Internal Medicine

## 2012-12-14 ENCOUNTER — Ambulatory Visit (INDEPENDENT_AMBULATORY_CARE_PROVIDER_SITE_OTHER): Payer: Medicare Other | Admitting: Internal Medicine

## 2012-12-14 ENCOUNTER — Telehealth (INDEPENDENT_AMBULATORY_CARE_PROVIDER_SITE_OTHER): Payer: Self-pay | Admitting: *Deleted

## 2012-12-14 VITALS — BP 138/84 | HR 78 | Temp 98.0°F | Resp 18 | Ht 63.0 in | Wt 173.2 lb

## 2012-12-14 DIAGNOSIS — R195 Other fecal abnormalities: Secondary | ICD-10-CM

## 2012-12-14 DIAGNOSIS — K589 Irritable bowel syndrome without diarrhea: Secondary | ICD-10-CM

## 2012-12-14 DIAGNOSIS — K219 Gastro-esophageal reflux disease without esophagitis: Secondary | ICD-10-CM

## 2012-12-14 DIAGNOSIS — Z1211 Encounter for screening for malignant neoplasm of colon: Secondary | ICD-10-CM

## 2012-12-14 MED ORDER — PSYLLIUM 28 % PO PACK: 1.0000 | PACK | Freq: Every day | ORAL | Status: DC

## 2012-12-14 MED ORDER — PEG-KCL-NACL-NASULF-NA ASC-C 100 G PO SOLR: 1.0000 | Freq: Once | ORAL | Status: DC

## 2012-12-14 NOTE — Progress Notes (Signed)
Presenting complaint;  Intermittent diarrhea.  Subjective:  Patient is 72 year old Caucasian female who is in for scheduled visit. She was last seen in August 2013. She has history of diarrhea felt to be secondary to irritable bowel syndrome. Her last colonoscopy was in May 2006. She now presents with recurrence of her diarrhea and urgency. She is taking 2 doses of Imodium every day. At least twice a week she is to take 3 doses per day. She is having intermittent accidents. She had 2 last week. Following one episode she passed large amount of liquid stool which leaked out of depends. She therefore would not eat her meal if she is going to be out of the house. She is also having normal stools in between. She rarely gets constipated. She denies abdominal pain melena or rectal bleeding. She denies nocturnal bowel movements or accidents. She gets up every night to urinate once or twice. Her appetite is very good and her weight has been stable. She is not having any side effects with Imodium. She did not take any antibiotics recently. She recalls she stopped fiber supplements several months ago and wonders if this is the reason for relapse of her symptoms. She takes no more than 4-5 doses of Advil every month. She denies abdominal pain nausea or vomiting. She experiences heartburn only with fatty or fried foods. She denies dysphasia. Family history significant for colon carcinoma in her father who was 62 at the time of diagnosis. She was diagnosed with diabetes mellitus in July last year and is under care of Dr. Fransico Him and is pleased with her treatment. She was admitted to [facility] 10/19/2012 to 10/22/2012 for chest pain and shortness of breath. Workup was negative.   Current Medications: Current Outpatient Prescriptions  Medication Sig Dispense Refill  . acetaminophen (TYLENOL) 500 MG tablet Take 500 mg by mouth as needed for pain.      Marland Kitchen aspirin EC 81 MG tablet Take 81 mg by mouth daily.      . clobetasol  cream (TEMOVATE) 0.05 % Apply topically 2 (two) times daily.      . hydrochlorothiazide (,MICROZIDE/HYDRODIURIL,) 12.5 MG capsule Take 12.5 mg by mouth daily.        Marland Kitchen ibuprofen (ADVIL,MOTRIN) 100 MG tablet Take 200 mg by mouth every 6 (six) hours as needed for fever.      . Insulin Detemir (LEVEMIR FLEXPEN) 100 UNIT/ML SOPN Inject 70 Units into the skin at bedtime.      . insulin lispro (HUMALOG KWIKPEN) 100 UNIT/ML SOPN Inject 20 Units into the skin 3 (three) times daily with meals. Uses sliding scale: 90-150=20, 151-200=21, 201-250=22, 251-300=23, 301-350=24, 351-400=25, 400 or more=26.      Marland Kitchen loperamide (IMODIUM) 2 MG capsule Take 2 mg by mouth 4 (four) times daily as needed for diarrhea or loose stools.       Marland Kitchen loratadine (CLARITIN) 10 MG tablet Take 10 mg by mouth daily.      . pantoprazole (PROTONIX) 40 MG tablet Take 40 mg by mouth 2 (two) times daily.       . phenytoin (DILANTIN) 100 MG ER capsule Take 400 mg by mouth daily.       . pravastatin (PRAVACHOL) 20 MG tablet Take 1 tablet (20 mg total) by mouth every evening.  90 tablet  1   No current facility-administered medications for this visit.     Objective: Blood pressure 138/84, pulse 78, temperature 98 F (36.7 C), temperature source Oral, resp. rate 18, height 5'  3" (1.6 m), weight 173 lb 3.2 oz (78.563 kg).  patient is alert and in no acute distress. Conjunctiva is pink. Sclera is nonicteric Oropharyngeal mucosa is normal. No neck masses or thyromegaly noted. Cardiac exam with regular rhythm normal S1 and S2. No murmur or gallop noted. Lungs are clear to auscultation. Abdomen is full. Bowel sounds are normal. On palpation is soft and nontender without organomegaly or masses. Rectal examination reveals soft brown stool in the vault which is guaiac positive.  No LE edema or clubbing noted.  Labs/studies Results: Lab data from recent hospitalization in September 2014 CBC from 10/19/2012. WBC 7.7, H&H 13.4 and 39.8 and  platelet count 260K Serum calcium was 10.1. Bilirubin 0.3, BP 136, AST 22, ALT 24 and albumin 3.7  Serum creatinine was 0.77  Assessment:  #1. IBS. Her symptoms have relapsed. She is having intermittent urgency and accidents which has been quite distressing to her and affecting quality of her life. She does not have a lot of symptoms except her stool is guaiac positive and she therefore needs to be further evaluated. Last colonoscopy was in May 2006 and biopsy from sigmoid colon was negative for microscopic colitis. #2. Heme positive stool. She is on low-dose aspirin and sporadic OTC ibuprofen. H&H 8 weeks ago was normal. #3. GERD. Symptoms fairly well controlled with double dose PPI. #4. Mildly elevated alkaline phosphatase noted on recent hospitalization.   Plan:  Metamucil 4 g by mouth daily. Take Imodium OTC 2 mg every morning and second toes only on days when she has to leave home. Diagnostic colonoscopy. Will check H&H and LFTs at the time of colonoscopy.

## 2012-12-14 NOTE — Patient Instructions (Signed)
Colonoscopy to be scheduled. Take Imodium OTC 2 mg every morning; take second dose if you have to leave home. Will check hemoglobin and LFTs at the time of colonoscopy

## 2012-12-14 NOTE — Telephone Encounter (Signed)
Patient needs movi prep 

## 2012-12-22 ENCOUNTER — Encounter (HOSPITAL_COMMUNITY): Payer: Self-pay | Admitting: Pharmacy Technician

## 2013-01-06 ENCOUNTER — Ambulatory Visit (HOSPITAL_COMMUNITY)
Admission: RE | Admit: 2013-01-06 | Discharge: 2013-01-06 | Disposition: A | Discharge status: Home Health | Payer: Medicare Other | Source: Ambulatory Visit | Attending: Internal Medicine | Admitting: Internal Medicine

## 2013-01-06 ENCOUNTER — Encounter (HOSPITAL_COMMUNITY): Payer: Self-pay | Admitting: *Deleted

## 2013-01-06 ENCOUNTER — Encounter (HOSPITAL_COMMUNITY)
Admission: RE | Disposition: A | Discharge status: Home Health | Payer: Self-pay | Source: Ambulatory Visit | Attending: Internal Medicine

## 2013-01-06 DIAGNOSIS — K648 Other hemorrhoids: Secondary | ICD-10-CM

## 2013-01-06 DIAGNOSIS — K921 Melena: Secondary | ICD-10-CM | POA: Insufficient documentation

## 2013-01-06 DIAGNOSIS — Q2733 Arteriovenous malformation of digestive system vessel: Secondary | ICD-10-CM

## 2013-01-06 DIAGNOSIS — E119 Type 2 diabetes mellitus without complications: Secondary | ICD-10-CM | POA: Insufficient documentation

## 2013-01-06 DIAGNOSIS — I10 Essential (primary) hypertension: Secondary | ICD-10-CM | POA: Insufficient documentation

## 2013-01-06 DIAGNOSIS — D126 Benign neoplasm of colon, unspecified: Secondary | ICD-10-CM

## 2013-01-06 DIAGNOSIS — Z01812 Encounter for preprocedural laboratory examination: Secondary | ICD-10-CM | POA: Insufficient documentation

## 2013-01-06 DIAGNOSIS — R195 Other fecal abnormalities: Secondary | ICD-10-CM

## 2013-01-06 HISTORY — PX: COLONOSCOPY: SHX5424

## 2013-01-06 LAB — HEMOGLOBIN AND HEMATOCRIT, BLOOD
HCT: 36.9 % (ref 36.0–46.0)
Hemoglobin: 12.1 g/dL (ref 12.0–15.0)

## 2013-01-06 LAB — HEPATIC FUNCTION PANEL
AST: 20 U/L (ref 0–37)
Albumin: 3.3 g/dL — ABNORMAL LOW (ref 3.5–5.2)

## 2013-01-06 SURGERY — COLONOSCOPY
Anesthesia: Moderate Sedation

## 2013-01-06 MED ORDER — SIMETHICONE 40 MG/0.6ML PO SUSP
ORAL | Status: DC | PRN
  Administered 2013-01-06: 12:00:00

## 2013-01-06 MED ORDER — MIDAZOLAM HCL 5 MG/5ML IJ SOLN
INTRAMUSCULAR | Status: DC | PRN
  Administered 2013-01-06 (×3): 2 mg via INTRAVENOUS

## 2013-01-06 MED ORDER — MEPERIDINE HCL 50 MG/ML IJ SOLN
INTRAMUSCULAR | Status: AC
  Filled 2013-01-06: qty 1

## 2013-01-06 MED ORDER — SODIUM CHLORIDE 0.9 % IV SOLN
INTRAVENOUS | Status: DC
  Administered 2013-01-06: 12:00:00 via INTRAVENOUS

## 2013-01-06 MED ORDER — MIDAZOLAM HCL 5 MG/5ML IJ SOLN
INTRAMUSCULAR | Status: AC
  Filled 2013-01-06: qty 10

## 2013-01-06 MED ORDER — MEPERIDINE HCL 50 MG/ML IJ SOLN
INTRAMUSCULAR | Status: DC | PRN
  Administered 2013-01-06 (×2): 25 mg via INTRAVENOUS

## 2013-01-06 NOTE — Op Note (Signed)
COLONOSCOPY PROCEDURE REPORT  PATIENT:  Dorothy Carey  MR#:  [id number] Birthdate:  1940-03-04, 72 y.o., female Endoscopist:  Dr. Malissa Hippo, MD Referred By:  Dr. Estanislado Pandy, MD  Procedure Date: 01/06/2013  Procedure:   Colonoscopy  Indications:  Patient is 72 year old Caucasian female who is undergoing diagnostic colonoscopy because of heme positive stool. She has chronic/intermittent diarrhea felt to be secondary to IBS. Patient's last colonoscopy was in May 2006 and sigmoid colon biopsy was negative for microscopic or collagenous colitis.  Informed Consent:  The procedure and risks were reviewed with the patient and informed consent was obtained.  Medications:  Demerol 50 mg IV Versed 7 mg IV  Description of procedure:  After a digital rectal exam was performed, that colonoscope was advanced from the anus through the rectum and colon to the area of the cecum, ileocecal valve and appendiceal orifice. The cecum was deeply intubated. These structures were well-seen and photographed for the record. From the level of the cecum and ileocecal valve, the scope was slowly and cautiously withdrawn. The mucosal surfaces were carefully surveyed utilizing scope tip to flexion to facilitate fold flattening as needed. The scope was pulled down into the rectum where a thorough exam including retroflexion was performed.  Findings:   Prep fair. Five polyps were cold snared and submitted together. Largest of these was 6-7 mm. These are located at hepatic flexure, splenic flexure and 3 at sigmoid colon. 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 Normal rectal mucosa. Small hemorrhoids above the dentate line.   Therapeutic/Diagnostic Maneuvers Performed:  See above  Complications:  None  Cecal Withdrawal Time:  18 minutes  Impression:  Examination performed to cecum. Examination somewhat limited because of quality of prep. Five small polyps cold snared and submitted together as  above. Small internal hemorrhoids.   Recommendations:  Standard instructions given. I will contact patient with biopsy results and further recommendations.  Tiena Manansala U  01/06/2013 1:21 PM  CC: Dr. Estanislado Pandy, MD & Dr. Bonnetta Barry ref. provider found

## 2013-01-06 NOTE — H&P (Signed)
Dorothy Carey is an 72 y.o. female.   Chief Complaint: Patient is  here for colonoscopy. HPI: Patient is a 72 year old Caucasian female who has history of tarry emergency felt to be secondary to irritable bowel syndrome. She was noted to have heme-positive stool. She denies melena or rectal bleeding. She occasionally see blood on the tissue. Since her office visit last month or stools have been normal. She denies abdominal anorexia weight loss. Since last colonoscopy was in May 2006. Biopsy from sigmoid colon was negative for microscopic or collagenous colitis. Family history significant for colon carcinoma in her father who is 4 at the time of diagnosis.  Past Medical History  Diagnosis Date  . Ruptured disk   . Internal hemorrhoid   . Hematochezia   . Seizures   . Chronic diarrhea   . IBS (irritable bowel syndrome) 10/15/2011  . Diabetes mellitus without complication   . Hypertension     Past Surgical History  Procedure Laterality Date  . Colonoscopy  06    ROURK  . Colonoscopy  04    FLEISHMAN  . Breast reduction surgery    . Foot surgery      Bone spurs  . Abdominal hysterectomy    . Appendectomy    . Bladder tac      History reviewed. No pertinent family history. Social History:  reports that she has never smoked. She has never used smokeless tobacco. She reports that she does not drink alcohol or use illicit drugs.  Allergies: No Known Allergies  Medications Prior to Admission  Medication Sig Dispense Refill  . acetaminophen (TYLENOL) 500 MG tablet Take 500 mg by mouth as needed for pain.      Marland Kitchen aspirin EC 81 MG tablet Take 81 mg by mouth daily.      . clobetasol cream (TEMOVATE) 0.05 % Apply topically 2 (two) times daily.      . hydrochlorothiazide (,MICROZIDE/HYDRODIURIL,) 12.5 MG capsule Take 12.5 mg by mouth daily.        Marland Kitchen ibuprofen (ADVIL,MOTRIN) 100 MG tablet Take 200 mg by mouth every 6 (six) hours as needed for fever.      . Insulin Detemir (LEVEMIR FLEXPEN)  100 UNIT/ML SOPN Inject 70 Units into the skin at bedtime.      . insulin lispro (HUMALOG KWIKPEN) 100 UNIT/ML SOPN Inject 20 Units into the skin 3 (three) times daily with meals. Uses sliding scale: 90-150=20, 151-200=21, 201-250=22, 251-300=23, 301-350=24, 351-400=25, 400 or more=26.      Marland Kitchen loperamide (IMODIUM) 2 MG capsule Take 2 mg by mouth 4 (four) times daily as needed for diarrhea or loose stools.       Marland Kitchen loratadine (CLARITIN) 10 MG tablet Take 10 mg by mouth daily.      . pantoprazole (PROTONIX) 40 MG tablet Take 40 mg by mouth 2 (two) times daily.       . peg 3350 powder (MOVIPREP) 100 G SOLR Take 1 kit (200 g total) by mouth once.  1 kit  0  . phenytoin (DILANTIN) 100 MG ER capsule Take 400 mg by mouth daily.       . pravastatin (PRAVACHOL) 20 MG tablet Take 1 tablet (20 mg total) by mouth every evening.  90 tablet  1  . psyllium (METAMUCIL SMOOTH TEXTURE) 28 % packet Take 1 packet by mouth at bedtime.        No results found for this or any previous visit (from the past 48 hour(s)). No results found.  ROS  Blood pressure 186/96, pulse 102, temperature 97 F (36.1 C), resp. rate 20, height 5' 3.5" (1.613 m), weight 173 lb (78.472 kg), SpO2 94.00%. Physical Exam  Constitutional: She appears well-developed and well-nourished.  HENT:  Mouth/Throat: Oropharynx is clear and moist.  Eyes: Conjunctivae are normal. No scleral icterus.  Neck: No thyromegaly present.  Cardiovascular: Normal rate, regular rhythm and normal heart sounds.   No murmur heard. Respiratory: Effort normal and breath sounds normal.  GI: Soft. She exhibits no distension and no mass. There is no tenderness.  Musculoskeletal: She exhibits no edema.  Lymphadenopathy:    She has no cervical adenopathy.  Neurological: She is alert.  Skin: Skin is warm and dry.     Assessment/Plan Heme-positive stools. History of diarrhea and urgency felt to be secondary to IBS.  REHMAN,NAJEEB U 01/06/2013, 12:24 PM

## 2013-01-12 ENCOUNTER — Encounter (HOSPITAL_COMMUNITY): Payer: Self-pay | Admitting: Internal Medicine

## 2013-01-13 ENCOUNTER — Encounter (INDEPENDENT_AMBULATORY_CARE_PROVIDER_SITE_OTHER): Payer: Self-pay | Admitting: *Deleted

## 2013-01-18 ENCOUNTER — Inpatient Hospital Stay (HOSPITAL_COMMUNITY): Payer: Medicare Other

## 2013-01-18 ENCOUNTER — Encounter (HOSPITAL_COMMUNITY): Payer: Self-pay | Admitting: Emergency Medicine

## 2013-01-18 ENCOUNTER — Other Ambulatory Visit: Payer: Self-pay

## 2013-01-18 ENCOUNTER — Emergency Department (HOSPITAL_COMMUNITY): Payer: Medicare Other

## 2013-01-18 ENCOUNTER — Inpatient Hospital Stay (HOSPITAL_COMMUNITY)
Admission: EM | Admit: 2013-01-18 | Discharge: 2013-01-20 | DRG: 066 | Disposition: A | Discharge status: Skilled Nursing Facility | Payer: Medicare Other | Attending: Internal Medicine | Admitting: Internal Medicine

## 2013-01-18 DIAGNOSIS — K589 Irritable bowel syndrome without diarrhea: Secondary | ICD-10-CM | POA: Diagnosis present

## 2013-01-18 DIAGNOSIS — E663 Overweight: Secondary | ICD-10-CM | POA: Diagnosis present

## 2013-01-18 DIAGNOSIS — E119 Type 2 diabetes mellitus without complications: Secondary | ICD-10-CM | POA: Diagnosis present

## 2013-01-18 DIAGNOSIS — Z7982 Long term (current) use of aspirin: Secondary | ICD-10-CM

## 2013-01-18 DIAGNOSIS — R1310 Dysphagia, unspecified: Secondary | ICD-10-CM | POA: Diagnosis present

## 2013-01-18 DIAGNOSIS — Z800 Family history of malignant neoplasm of digestive organs: Secondary | ICD-10-CM

## 2013-01-18 DIAGNOSIS — I10 Essential (primary) hypertension: Secondary | ICD-10-CM | POA: Diagnosis present

## 2013-01-18 DIAGNOSIS — R079 Chest pain, unspecified: Secondary | ICD-10-CM

## 2013-01-18 DIAGNOSIS — Z794 Long term (current) use of insulin: Secondary | ICD-10-CM

## 2013-01-18 DIAGNOSIS — R197 Diarrhea, unspecified: Secondary | ICD-10-CM

## 2013-01-18 DIAGNOSIS — I639 Cerebral infarction, unspecified: Secondary | ICD-10-CM

## 2013-01-18 DIAGNOSIS — E7800 Pure hypercholesterolemia, unspecified: Secondary | ICD-10-CM

## 2013-01-18 DIAGNOSIS — G40909 Epilepsy, unspecified, not intractable, without status epilepticus: Secondary | ICD-10-CM

## 2013-01-18 DIAGNOSIS — R0602 Shortness of breath: Secondary | ICD-10-CM

## 2013-01-18 DIAGNOSIS — R29810 Facial weakness: Secondary | ICD-10-CM | POA: Diagnosis present

## 2013-01-18 DIAGNOSIS — I6350 Cerebral infarction due to unspecified occlusion or stenosis of unspecified cerebral artery: Principal | ICD-10-CM | POA: Diagnosis present

## 2013-01-18 LAB — CBC WITH DIFFERENTIAL/PLATELET
Basophils Absolute: 0 K/uL (ref 0.0–0.1)
Basophils Relative: 1 % (ref 0–1)
Eosinophils Absolute: 0.2 K/uL (ref 0.0–0.7)
Eosinophils Relative: 3 % (ref 0–5)
HCT: 41 % (ref 36.0–46.0)
Hemoglobin: 13.4 g/dL (ref 12.0–15.0)
Lymphocytes Relative: 16 % (ref 12–46)
Lymphs Abs: 1.2 K/uL (ref 0.7–4.0)
MCH: 29.4 pg (ref 26.0–34.0)
MCHC: 32.7 g/dL (ref 30.0–36.0)
MCV: 89.9 fL (ref 78.0–100.0)
Monocytes Absolute: 0.9 K/uL (ref 0.1–1.0)
Monocytes Relative: 12 % (ref 3–12)
Neutro Abs: 5 K/uL (ref 1.7–7.7)
Neutrophils Relative %: 68 % (ref 43–77)
Platelets: 294 K/uL (ref 150–400)
RBC: 4.56 MIL/uL (ref 3.87–5.11)
RDW: 12.7 % (ref 11.5–15.5)
WBC: 7.2 K/uL (ref 4.0–10.5)

## 2013-01-18 LAB — COMPREHENSIVE METABOLIC PANEL WITH GFR
ALT: 14 U/L (ref 0–35)
AST: 12 U/L (ref 0–37)
Albumin: 3.4 g/dL — ABNORMAL LOW (ref 3.5–5.2)
Alkaline Phosphatase: 163 U/L — ABNORMAL HIGH (ref 39–117)
BUN: 18 mg/dL (ref 6–23)
CO2: 24 meq/L (ref 19–32)
Calcium: 10 mg/dL (ref 8.4–10.5)
Chloride: 103 meq/L (ref 96–112)
Creatinine, Ser: 0.79 mg/dL (ref 0.50–1.10)
GFR calc Af Amer: >90 mL/min
GFR calc non Af Amer: 81 mL/min — ABNORMAL LOW
Glucose, Bld: 188 mg/dL — ABNORMAL HIGH (ref 70–99)
Potassium: 3.7 meq/L (ref 3.5–5.1)
Sodium: 140 meq/L (ref 135–145)
Total Bilirubin: 0.3 mg/dL (ref 0.3–1.2)
Total Protein: 8.6 g/dL — ABNORMAL HIGH (ref 6.0–8.3)

## 2013-01-18 LAB — GLUCOSE, CAPILLARY: Glucose-Capillary: 171 mg/dL — ABNORMAL HIGH (ref 70–99)

## 2013-01-18 LAB — PROTIME-INR: INR: 1.03 (ref 0.00–1.49)

## 2013-01-18 LAB — PHENYTOIN LEVEL, TOTAL: Phenytoin Lvl: 5.2 ug/mL — ABNORMAL LOW (ref 10.0–20.0)

## 2013-01-18 MED ORDER — ONDANSETRON HCL 4 MG/2ML IJ SOLN
4.0000 mg | Freq: Four times a day (QID) | INTRAMUSCULAR | Status: DC | PRN
  Administered 2013-01-18: 4 mg via INTRAVENOUS
  Filled 2013-01-18: qty 2

## 2013-01-18 MED ORDER — ACETAMINOPHEN 325 MG PO TABS
650.0000 mg | ORAL_TABLET | ORAL | Status: DC | PRN
  Administered 2013-01-19: 650 mg via ORAL
  Filled 2013-01-18: qty 2

## 2013-01-18 MED ORDER — PHENYTOIN SODIUM 50 MG/ML IJ SOLN
500.0000 mg | Freq: Once | INTRAMUSCULAR | Status: AC
  Administered 2013-01-18: 500 mg via INTRAVENOUS
  Filled 2013-01-18: qty 10

## 2013-01-18 MED ORDER — INSULIN ASPART 100 UNIT/ML ~~LOC~~ SOLN: 0.0000 [IU] | Freq: Four times a day (QID) | SUBCUTANEOUS | Status: DC

## 2013-01-18 MED ORDER — ASPIRIN 325 MG PO TABS
325.0000 mg | ORAL_TABLET | Freq: Every day | ORAL | Status: DC
  Administered 2013-01-19: 325 mg via ORAL
  Filled 2013-01-18: qty 1

## 2013-01-18 MED ORDER — INSULIN ASPART 100 UNIT/ML ~~LOC~~ SOLN
0.0000 [IU] | Freq: Four times a day (QID) | SUBCUTANEOUS | Status: DC
  Administered 2013-01-18: 2 [IU] via SUBCUTANEOUS
  Administered 2013-01-19: 1 [IU] via SUBCUTANEOUS
  Administered 2013-01-19: 2 [IU] via SUBCUTANEOUS
  Administered 2013-01-19: 1 [IU] via SUBCUTANEOUS

## 2013-01-18 MED ORDER — ENOXAPARIN SODIUM 40 MG/0.4ML ~~LOC~~ SOLN
40.0000 mg | SUBCUTANEOUS | Status: DC
  Administered 2013-01-18 – 2013-01-19 (×2): 40 mg via SUBCUTANEOUS
  Filled 2013-01-18 (×2): qty 0.4

## 2013-01-18 MED ORDER — HYDRALAZINE HCL 20 MG/ML IJ SOLN: 5.0000 mg | Freq: Four times a day (QID) | INTRAMUSCULAR | Status: DC | PRN

## 2013-01-18 MED ORDER — ACETAMINOPHEN 650 MG RE SUPP: 650.0000 mg | RECTAL | Status: DC | PRN

## 2013-01-18 MED ORDER — LORAZEPAM 2 MG/ML IJ SOLN
0.5000 mg | Freq: Four times a day (QID) | INTRAMUSCULAR | Status: DC | PRN
  Administered 2013-01-18 – 2013-01-19 (×2): 0.5 mg via INTRAVENOUS
  Filled 2013-01-18 (×2): qty 1

## 2013-01-18 MED ORDER — PANTOPRAZOLE SODIUM 40 MG IV SOLR
40.0000 mg | INTRAVENOUS | Status: DC
  Administered 2013-01-18: 40 mg via INTRAVENOUS
  Filled 2013-01-18 (×2): qty 40

## 2013-01-18 MED ORDER — ASPIRIN 300 MG RE SUPP
300.0000 mg | Freq: Every day | RECTAL | Status: DC
  Administered 2013-01-18: 300 mg via RECTAL
  Filled 2013-01-18 (×2): qty 1

## 2013-01-18 MED ORDER — PHENYTOIN SODIUM 50 MG/ML IJ SOLN
100.0000 mg | Freq: Four times a day (QID) | INTRAMUSCULAR | Status: DC
  Administered 2013-01-18 – 2013-01-19 (×4): 100 mg via INTRAVENOUS
  Filled 2013-01-18 (×4): qty 2

## 2013-01-18 MED ORDER — INSULIN DETEMIR 100 UNIT/ML ~~LOC~~ SOLN
30.0000 [IU] | Freq: Every day | SUBCUTANEOUS | Status: DC
  Administered 2013-01-18 – 2013-01-19 (×2): 30 [IU] via SUBCUTANEOUS
  Filled 2013-01-18 (×3): qty 0.3

## 2013-01-18 NOTE — Progress Notes (Signed)
Patient having loose stools, which is a chronic issue for patient d/t IBS.  Recently had colonoscopy.  Small amount of blood noted in stool.

## 2013-01-18 NOTE — ED Notes (Signed)
Pt began coughing after small sip of water given during RN stroke swallow screening, pt did not pass screen, per pt's son that pt coughs all the time

## 2013-01-18 NOTE — Evaluation (Signed)
Occupational Therapy Evaluation Patient Details Name: THETA LEAF MRN: [id number] DOB: 01/14/1941 Today's Date: 01/18/2013 Time: 1324-4010 OT Time Calculation (min): 44 min Evaluation 1540-1604 (24) Self Cares 2725-3664 (20')  OT Assessment / Plan / Recommendation History of present illness Patient is a 72 yo right handed female admitted with acute ischemic stroke. Presented to ED with her son after falling at home multiple times and RUE/RLE weakness, facial droop and slurred speeck.   History of seizure disorder, DM    Clinical Impression   Patient presents with decreased strength and coordination RUE, decreased overall  Functional independence with ADL tasks and mobility.  Recommend patient would benefit from skilled OT services to maximize functional potential/independence. Plan return home with family and continued therapy services.   OT Assessment  Patient needs continued OT Services    Follow Up Recommendations  Home health OT;Outpatient OT (recommend life [facility] for home )    Barriers to Discharge  (lives alone )    Equipment Recommendations       Recommendations for Other Services PT consult  Frequency  Min 5X/week    Precautions / Restrictions Precautions Precautions: Fall       ADL  Grooming: Wash/dry hands;Minimal assistance Upper Body Dressing: Minimal assistance Lower Body Dressing: Minimal assistance Toilet Transfer: Minimal assistance Toilet Transfer Method: Sit to stand;Stand pivot Toilet Transfer Equipment: Comfort height toilet Toileting - Clothing Manipulation and Hygiene: Minimal assistance    OT Diagnosis: Generalized weakness;Ataxia  OT Problem List: Decreased strength;Decreased coordination;Decreased activity tolerance;Impaired balance (sitting and/or standing);Impaired UE functional use OT Treatment Interventions: Self-care/ADL training;Therapeutic exercise;Therapeutic activities;Neuromuscular education;DME and/or AE instruction;Patient/family  education;Balance training;Energy conservation   OT Goals(Current goals can be found in the care plan section) Acute Rehab OT Goals Patient Stated Goal: to return home  OT Goal Formulation: With patient/family Time For Goal Achievement: 02/01/13 Potential to Achieve Goals: Good ADL Goals Pt Will Perform Grooming: with supervision;standing Pt Will Perform Lower Body Dressing: with min guard assist;with adaptive equipment Pt Will Transfer to Toilet: with supervision;grab bars Pt Will Perform Toileting - Clothing Manipulation and hygiene: with supervision Pt/caregiver will Perform Home Exercise Program: Increased strength;Right Upper extremity;With Supervision  Visit Information  History of Present Illness: Patient is a 72 yo right handed female admitted with acute ischemic stroke. Presented to ED with her son after falling at home multiple times and RUE/RLE weakness, facial droop and slurred speeck.   History of seizure disorder, DM        Prior Functioning     Home Living Family/patient expects to be discharged to:: Private residence Living Arrangements: Alone Available Help at Discharge: Family Type of Home: House Home Access: Stairs to enter Secretary/administrator of Steps: 2 Entrance Stairs-Rails: Right;Left;Can reach both Home Layout: One level Home Equipment: Hand held shower head;Grab bars - tub/shower Prior Function Level of Independence: Independent Communication Communication:  (patient with slightly slurred speech) Dominant Hand: Right         Vision/Perception Vision - History Baseline Vision: Wears glasses all the time Patient Visual Report: No change from baseline   Cognition  Cognition Arousal/Alertness: Awake/alert Behavior During Therapy: WFL for tasks assessed/performed Overall Cognitive Status: Within Functional Limits for tasks assessed    Extremity/Trunk Assessment Upper Extremity Assessment Upper Extremity Assessment: RUE  deficits/detail;Generalized weakness RUE Deficits / Details: patient with decreased coordination RUE; ataxic movements, delayed ROM however WFL AROM RUE Lower Extremity Assessment Lower Extremity Assessment: Defer to PT evaluation     Mobility Bed Mobility Bed Mobility: Rolling  Right Rolling Right: 4: Min assist;With rail Transfers Transfers: Sit to Stand;Stand to Sit Sit to Stand: 4: Min assist Stand to Sit: 4: Min assist           End of Session OT - End of Session Equipment Utilized During Treatment: Gait belt Activity Tolerance: Patient tolerated treatment well;Patient limited by fatigue Patient left: in bed;with call bell/phone within reach Nurse Communication: Mobility status;Other (comment) (runny, bloody stool)  GO     Velora Mediate, OTR/L  01/18/2013, 4:41 PM

## 2013-01-18 NOTE — ED Notes (Signed)
Pt went to bed at midnight last night and all was well. States awoke at 0830 this am, fell getting out of bed. Fell x 4 this am then called son to come get her. Facial droop to right side. Slow to follow commands. Weakness noted more to right hand and leg. Pt is alert/oriented. Slurred speech noted. EDP in now

## 2013-01-18 NOTE — H&P (Signed)
History and Physical  Dorothy Carey:[id number] DOB: 1940-07-17 DOA: 01/18/2013  Referring physician: Dr. Estell Harpin in ED PCP: Estanislado Pandy, MD   Chief Complaint: leg weakness  HPI:  72 year old woman presented to ED after falling this morning, noted slurred speech, facial droop, right leg weakness. Initial workup revealed stroke and patient was referred for admission.  Patient felt well yesterday. This morning she woke up, tented address resolved but had difficulty putting her shirt on with her right arm. She ambulated to the bathroom and fell. She called her son who noted right upper and right lower extremity weakness as well as severely slurred speech. He assisted her to the car and transported her to the hospital. In the emergency department she is dramatically improved although she still has some right facial droop, slurred speech and mild right upper and right lower extremity weakness.  In the emergency department afebrile, hypertensive systolic 201, tachycardic heart rate 120s. No hypoxia. Complete metabolic panel unremarkable. CBC unremarkable. CT of the head and suggested the possibility of subacute ischemic stroke. No hemorrhage. EKG showed sinus tachycardia, left bundle branch block, compared to previous study 10/19/2012 left bundle branch block is old. No acute changes seen.  Review of Systems:  Negative for fever, visual changes, sore throat, rash, new muscle aches, chest pain, SOB, dysuria, bleeding, v/abdominal pain.  Vomited this AM  Past Medical History  Diagnosis Date  . Ruptured disk   . Internal hemorrhoid   . Hematochezia   . Seizures   . Chronic diarrhea   . IBS (irritable bowel syndrome) 10/15/2011  . Diabetes mellitus without complication   . Hypertension     Past Surgical History  Procedure Laterality Date  . Colonoscopy  06    ROURK  . Colonoscopy  04    FLEISHMAN  . Breast reduction surgery    . Foot surgery      Bone spurs  . Abdominal hysterectomy     . Appendectomy    . Bladder tac    . Colonoscopy N/A 01/06/2013    Procedure: COLONOSCOPY;  Surgeon: Malissa Hippo, MD;  Location: [facility];  Service: Endoscopy;  Laterality: N/A;  100  . Cholecystectomy      Social History:  reports that she has never smoked. She has never used smokeless tobacco. She reports that she does not drink alcohol or use illicit drugs.  No Known Allergies  Family History  Problem Relation Age of Onset  . COPD Mother   . Liver cancer Father      Prior to Admission medications   Medication Sig Start Date End Date Taking? Authorizing Provider  acetaminophen (TYLENOL) 500 MG tablet Take 500 mg by mouth as needed for pain.   Yes Historical Provider, MD  aspirin EC 81 MG tablet Take 81 mg by mouth daily.   Yes Historical Provider, MD  clobetasol cream (TEMOVATE) 0.05 % Apply topically 2 (two) times daily.   Yes Historical Provider, MD  hydrochlorothiazide (,MICROZIDE/HYDRODIURIL,) 12.5 MG capsule Take 12.5 mg by mouth daily.     Yes Historical Provider, MD  ibuprofen (ADVIL,MOTRIN) 100 MG tablet Take 200 mg by mouth every 6 (six) hours as needed for fever.   Yes Historical Provider, MD  Insulin Detemir (LEVEMIR FLEXPEN) 100 UNIT/ML SOPN Inject 70 Units into the skin at bedtime.   Yes Historical Provider, MD  insulin lispro (HUMALOG KWIKPEN) 100 UNIT/ML SOPN Inject 20 Units into the skin 3 (three) times daily with meals. Uses sliding scale: 90-150=20,  151-200=21, 201-250=22, 251-300=23, 301-350=24, 351-400=25, 400 or more=26.   Yes Historical Provider, MD  loperamide (IMODIUM) 2 MG capsule Take 2 mg by mouth 4 (four) times daily as needed for diarrhea or loose stools.    Yes Historical Provider, MD  loratadine (CLARITIN) 10 MG tablet Take 10 mg by mouth daily.   Yes Historical Provider, MD  pantoprazole (PROTONIX) 40 MG tablet Take 40 mg by mouth 2 (two) times daily.    Yes Historical Provider, MD  phenytoin (DILANTIN) 100 MG ER capsule Take 400 mg by mouth  daily.    Yes Historical Provider, MD  pravastatin (PRAVACHOL) 20 MG tablet Take 1 tablet (20 mg total) by mouth every evening. 11/10/12  Yes Jodelle Gross, NP  psyllium (METAMUCIL SMOOTH TEXTURE) 28 % packet Take 1 packet by mouth at bedtime. 12/14/12  Yes Malissa Hippo, MD   Physical Exam: Filed Vitals:   01/18/13 0929 01/18/13 1051 01/18/13 1054 01/18/13 1100  BP: 201/93 189/83    Pulse: 119 124  124  Temp: 97.9 F (36.6 C)  97.3 F (36.3 C)   TempSrc: Oral     Resp: 22 22  20   Height: 5' 3.5" (1.613 m)     Weight: 77.111 kg (170 lb)     SpO2: 95% 97%  95%    General: Examined in the emergency department. Appears calm and comfortable Eyes: PERRL, normal lids, irises.  ENT: grossly normal hearing, lips & tongue Neck: no LAD, masses or thyromegaly Cardiovascular: RRR, no m/g. 2/6 systolic mumur best heard LUSB. No LE edema. Telemetry: ST, no arrhythmias  Respiratory: CTA bilaterally, no w/r/r. Normal respiratory effort. Abdomen: soft, ntnd Skin: no rash or induration seen o Musculoskeletal: Grossly normal tone and strength in left upper and left lower extremity. Right upper and right lower extremity 4/5 strength. Normal tone. Psychiatric: grossly normal mood and affect, speech slightly slurred but easily understandable. Appropriate. Neurologic: Right-sided facial weakness noted. Extraocular movements are intact. Tongue midline. No upper extremity dysdiadochokinesis. No pronator drift.  Wt Readings from Last 3 Encounters:  01/18/13 77.111 kg (170 lb)  01/06/13 78.472 kg (173 lb)  01/06/13 78.472 kg (173 lb)    Labs on Admission:  Basic Metabolic Panel:  Recent Labs Lab 01/18/13 0933  NA 140  K 3.7  CL 103  CO2 24  GLUCOSE 188*  BUN 18  CREATININE 0.79  CALCIUM 10.0    Liver Function Tests:  Recent Labs Lab 01/18/13 0933  AST 12  ALT 14  ALKPHOS 163*  BILITOT 0.3  PROT 8.6*  ALBUMIN 3.4*    CBC:  Recent Labs Lab 01/18/13 0933  WBC 7.2   NEUTROABS 5.0  HGB 13.4  HCT 41.0  MCV 89.9  PLT 294      Recent Labs  10/19/12 1124  PROBNP 160.7*    CBG:  Recent Labs Lab 01/18/13 0930  GLUCAP 171*     Radiological Exams on Admission: Ct Head Wo Contrast  01/18/2013   CLINICAL DATA:  Right hemiparesis with right-sided facial droop and fall x4 this morning. Code stroke.  EXAM: CT HEAD WITHOUT CONTRAST  TECHNIQUE: Contiguous axial images were obtained from the base of the skull through the vertex without intravenous contrast.  COMPARISON:  01/10/2004  FINDINGS: Ventricles, cisterns and other CSF spaces are within normal. There is evidence of chronic ischemic microvascular disease. There is a 2.5 x 3.2 cm region of low-attenuation over the gray and white matter of the left frontal lobe as cannot  exclude subacute infarction. There is mild focal low-attenuation over the high right parietal gray and white matter likely an old small infarct. There is an ovoid 9 mm focus of low attenuation adjacent the right frontal horn likely chronic although cannot exclude focal subacute ischemic change. There is no midline shift and no evidence of acute hemorrhage. Bone soft tissues are within normal.  IMPRESSION: Region of low-attenuation over the left frontal lobe which may be chronic, although cannot exclude subacute ischemic change. Focal 8 mm region of low attenuation adjacent the right frontal horn likely chronic, although cannot exclude acute to subacute ischemic change. Evidence of an old small high right parietal infarct. No acute hemorrhage.  Chronic ischemic microvascular disease.  Critical Value/emergent results were called by telephone at the time of interpretation on 01/18/2013 at 10:09 AM to Dr.JOSEPH ZAMMIT , who verbally acknowledged these results.   Electronically Signed   By: Elberta Fortis M.D.   On: 01/18/2013 10:09    EKG: Independently reviewed. As in history of present illness   Principal Problem:   CVA (cerebral  infarction) Active Problems:   IBS (irritable bowel syndrome)   Type II or unspecified type diabetes mellitus without mention of complication, not stated as uncontrolled   Seizure disorder   Assessment/Plan 1. Acute ischemic stroke: Presented with a right upper and lower extremity weakness, facial droop, slurred speech and dysphagia. Failed swallow screen. Improving in the emergency department. Not a candidate for TPA secondary to unknown onset. Admit for stroke evaluation, neurology consultation, therapy evaluations. N.p.o. for now, resume oral medications when able. Neurology consultation.  2. History of seizure disorder: Continue phenytoin per IV until can take by mouth 3. Hypertension: Permissive hypertension. 4. Diabetes mellitus: Sliding scale insulin. Decrease dose of Levemir until eating. 5. Irritable bowel: Stable.   Code Status: full code  DVT prophylaxis:Lovenox Family Communication: Discussed with son and daughter at bedside Disposition Plan/Anticipated LOS: Admit 2-3 days.  Time spent: 50 minutes  Brendia Sacks, MD  [facility] Pager [phone] 01/18/2013, 11:16 AM

## 2013-01-18 NOTE — Progress Notes (Signed)
MEDICATION RELATED CONSULT NOTE - INITIAL   Pharmacy Consult for Dilantin IV (chronic Rx PTA) Indication: h/o seizures, presented to ED with presumed stroke  No Known Allergies  Patient Measurements: Height: 5' 3.5" (161.3 cm) Weight: 170 lb (77.111 kg) IBW/kg (Calculated) : 53.55  Vital Signs: Temp: 97.3 F (36.3 C) (12/01 1054) Temp src: Oral (12/01 0929) BP: 198/88 mmHg (12/01 1100) Pulse Rate: 124 (12/01 1100) Intake/Output from previous day:   Intake/Output from this shift:    Labs:  Recent Labs  01/18/13 0933  WBC 7.2  HGB 13.4  HCT 41.0  PLT 294  CREATININE 0.79  ALBUMIN 3.4*  PROT 8.6*  AST 12  ALT 14  ALKPHOS 163*  BILITOT 0.3   Estimated Creatinine Clearance: 62.3 ml/min (by C-G formula based on Cr of 0.79).  Microbiology: No results found for this or any previous visit (from the past 720 hour(s)).  Medical History: Past Medical History  Diagnosis Date  . Ruptured disk   . Internal hemorrhoid   . Hematochezia   . Seizures   . Chronic diarrhea   . IBS (irritable bowel syndrome) 10/15/2011  . Diabetes mellitus without complication   . Hypertension    Medications:   (Not in a hospital admission)  Assessment: 72yo female who presented to ED with sx's of stroke.  Pt was on Dilantin 400mg  once daily PTA. Dilantin level on admission = 5.2 Albumin level = 3.4 Corrected Dilantin level = 6.7  Goal of Therapy:  Therapeutic Dilantin Level = 10-20  Plan:  - Dilantin 500mg  IV now x 1 (Calculated dose needed to increase Dilantin level to therapeutic range) - Dilantin 100mg  IV q6hrs starting tonight - Convert to PO Dilantin when feasible - Recheck Dilantin level in 5-7 days  Valrie Hart A 01/18/2013,1:04 PM

## 2013-01-18 NOTE — ED Provider Notes (Signed)
CSN: [id number]     Arrival date & time 01/18/13  0920 History  This chart was scribed for Benny Lennert, MD by Bennett Scrape, ED Scribe. This patient was seen in room APA10/APA10 and the patient's care was started at 9:27 AM.   Chief Complaint  Patient presents with  . Code Stroke   Level 5 Caveat- AMS  Patient is a 72 y.o. female presenting with weakness. The history is provided by the patient (pt awoke weak and fell). No language interpreter was used.  Weakness This is a new problem. The current episode started 6 to 12 hours ago. The problem occurs constantly. The problem has not changed since onset.Nothing aggravates the symptoms. Nothing relieves the symptoms.    HPI Comments: Dorothy Carey is a 72 y.o. female who presents to the Emergency Department complaining of sudden onset right-sided weakness with associated decreased ability to ambulate upon waking this morning. She denies having any problems ambulating last night. She reports that she went to bed around 12a and woke up around 8:30a this morning. She states that she called her son to come bring her to the ED after falling 4 timers this morning due to the weakness. Pt is currently in an [facility] and is unable to provide any further details.   Pt lives by herself  Past Medical History  Diagnosis Date  . Ruptured disk   . Internal hemorrhoid   . Hematochezia   . Seizures   . Chronic diarrhea   . IBS (irritable bowel syndrome) 10/15/2011  . Diabetes mellitus without complication   . Hypertension    Past Surgical History  Procedure Laterality Date  . Colonoscopy  06    ROURK  . Colonoscopy  04    FLEISHMAN  . Breast reduction surgery    . Foot surgery      Bone spurs  . Abdominal hysterectomy    . Appendectomy    . Bladder tac    . Colonoscopy N/A 01/06/2013    Procedure: COLONOSCOPY;  Surgeon: Malissa Hippo, MD;  Location: [facility];  Service: Endoscopy;  Laterality: N/A;  100   No family history on  file. History  Substance Use Topics  . Smoking status: Never Smoker   . Smokeless tobacco: Never Used  . Alcohol Use: No   No OB history provided.  Review of Systems  Unable to perform ROS: Mental status change  Neurological: Positive for weakness.    Allergies  Review of patient's allergies indicates no known allergies.  Home Medications   Current Outpatient Rx  Name  Route  Sig  Dispense  Refill  . acetaminophen (TYLENOL) 500 MG tablet   Oral   Take 500 mg by mouth as needed for pain.         Marland Kitchen aspirin EC 81 MG tablet   Oral   Take 81 mg by mouth daily.         . clobetasol cream (TEMOVATE) 0.05 %   Topical   Apply topically 2 (two) times daily.         . hydrochlorothiazide (,MICROZIDE/HYDRODIURIL,) 12.5 MG capsule   Oral   Take 12.5 mg by mouth daily.           Marland Kitchen ibuprofen (ADVIL,MOTRIN) 100 MG tablet   Oral   Take 200 mg by mouth every 6 (six) hours as needed for fever.         . Insulin Detemir (LEVEMIR FLEXPEN) 100 UNIT/ML SOPN   Subcutaneous  Inject 70 Units into the skin at bedtime.         . insulin lispro (HUMALOG KWIKPEN) 100 UNIT/ML SOPN   Subcutaneous   Inject 20 Units into the skin 3 (three) times daily with meals. Uses sliding scale: 90-150=20, 151-200=21, 201-250=22, 251-300=23, 301-350=24, 351-400=25, 400 or more=26.         Marland Kitchen loperamide (IMODIUM) 2 MG capsule   Oral   Take 2 mg by mouth 4 (four) times daily as needed for diarrhea or loose stools.          Marland Kitchen loratadine (CLARITIN) 10 MG tablet   Oral   Take 10 mg by mouth daily.         . pantoprazole (PROTONIX) 40 MG tablet   Oral   Take 40 mg by mouth 2 (two) times daily.          . phenytoin (DILANTIN) 100 MG ER capsule   Oral   Take 400 mg by mouth daily.          . pravastatin (PRAVACHOL) 20 MG tablet   Oral   Take 1 tablet (20 mg total) by mouth every evening.   90 tablet   1   . psyllium (METAMUCIL SMOOTH TEXTURE) 28 % packet   Oral   Take 1 packet  by mouth at bedtime.          Triage Vitals: BP 201/93  Pulse 119  Temp(Src) 97.9 F (36.6 C) (Oral)  Resp 22  Ht 5' 3.5" (1.613 m)  Wt 170 lb (77.111 kg)  BMI 29.64 kg/m2  SpO2 95%  Physical Exam  Nursing note and vitals reviewed. Constitutional: She appears well-developed and well-nourished.  HENT:  Head: Normocephalic and atraumatic.  Eyes: Conjunctivae and EOM are normal. No scleral icterus.  Neck: Neck supple. No thyromegaly present.  Cardiovascular: Regular rhythm.  Tachycardia present.  Exam reveals no gallop and no friction rub.   No murmur heard. Pulmonary/Chest: Effort normal and breath sounds normal. No stridor. She has no wheezes. She has no rales. She exhibits no tenderness.  Abdominal: She exhibits no distension. There is no tenderness. There is no rebound.  Musculoskeletal: Normal range of motion. She exhibits no edema.  Lymphadenopathy:    She has no cervical adenopathy.  Neurological: She is alert.  Profound weakness in right arm and right leg, slurred speech, right facial droop, slow to respond  Skin: No rash noted. No erythema.  Psychiatric: She has a normal mood and affect. Her behavior is normal.    ED Course  Procedures (including critical care time)  DIAGNOSTIC STUDIES: Oxygen Saturation is 95% on RA, adequate by my interpretation.    COORDINATION OF CARE: 9:32 AM-Discussed treatment plan with pt at bedside and pt agreed to plan.   Spoke with Neurology. Agrees pt is out of TPA window. Advises to admit for strength rehab.   11:08 AM- Pt rechecked and slurred speech and facial droop have improved. Upon re-exam, pt still has profound right-sided weakness. Discussed consult with neurologist and plan to admit with pt and pt agreed.    11:25 AM-Spoke with hospitalist. Patient case explained and discussed. Agrees to admit patient for further evaluation and treatment.   Labs Review Labs Reviewed - No data to display Imaging Review No results  found.  EKG Interpretation   None     CRITICAL CARE Performed by: Arden Axon L Total critical care time: 40 Critical care time was exclusive of separately billable procedures and treating other patients. Critical  care was necessary to treat or prevent imminent or life-threatening deterioration. Critical care was time spent personally by me on the following activities: development of treatment plan with patient and/or surrogate as well as nursing, discussions with consultants, evaluation of patient's response to treatment, examination of patient, obtaining history from patient or surrogate, ordering and performing treatments and interventions, ordering and review of laboratory studies, ordering and review of radiographic studies, pulse oximetry and re-evaluation of patient's condition.   MDM  No diagnosis found.   Stroke admit The chart was scribed for me under my direct supervision.  I personally performed the history, physical, and medical decision making and all procedures in the evaluation of this patient.Benny Lennert, MD 01/18/13 [phone]

## 2013-01-18 NOTE — ED Notes (Signed)
Pt stood from w/c. Moving right foot but dragging slightly. Needed little help to stretcher.

## 2013-01-19 DIAGNOSIS — I059 Rheumatic mitral valve disease, unspecified: Secondary | ICD-10-CM

## 2013-01-19 DIAGNOSIS — E7800 Pure hypercholesterolemia, unspecified: Secondary | ICD-10-CM

## 2013-01-19 LAB — GLUCOSE, CAPILLARY
Glucose-Capillary: 126 mg/dL — ABNORMAL HIGH (ref 70–99)
Glucose-Capillary: 138 mg/dL — ABNORMAL HIGH (ref 70–99)
Glucose-Capillary: 165 mg/dL — ABNORMAL HIGH (ref 70–99)

## 2013-01-19 LAB — LIPID PANEL
LDL Cholesterol: 103 mg/dL — ABNORMAL HIGH (ref 0–99)
Triglycerides: 163 mg/dL — ABNORMAL HIGH (ref <150)

## 2013-01-19 LAB — COMPREHENSIVE METABOLIC PANEL
ALT: 13 U/L (ref 0–35)
AST: 13 U/L (ref 0–37)
Albumin: 3 g/dL — ABNORMAL LOW (ref 3.5–5.2)
Alkaline Phosphatase: 125 U/L — ABNORMAL HIGH (ref 39–117)
BUN: 18 mg/dL (ref 6–23)
CO2: 25 mEq/L (ref 19–32)
Calcium: 9.2 mg/dL (ref 8.4–10.5)
Potassium: 3.9 mEq/L (ref 3.5–5.1)
Sodium: 141 mEq/L (ref 135–145)
Total Protein: 7.7 g/dL (ref 6.0–8.3)

## 2013-01-19 LAB — HEMOGLOBIN A1C: Hgb A1c MFr Bld: 6.6 % — ABNORMAL HIGH (ref <5.7)

## 2013-01-19 MED ORDER — PANTOPRAZOLE SODIUM 40 MG PO TBEC
40.0000 mg | DELAYED_RELEASE_TABLET | Freq: Every day | ORAL | Status: DC
  Administered 2013-01-19 – 2013-01-20 (×2): 40 mg via ORAL
  Filled 2013-01-19 (×2): qty 1

## 2013-01-19 MED ORDER — CLOPIDOGREL BISULFATE 75 MG PO TABS
75.0000 mg | ORAL_TABLET | Freq: Every day | ORAL | Status: DC
  Administered 2013-01-19 – 2013-01-20 (×2): 75 mg via ORAL
  Filled 2013-01-19 (×2): qty 1

## 2013-01-19 MED ORDER — PHENYTOIN SODIUM EXTENDED 100 MG PO CAPS
100.0000 mg | ORAL_CAPSULE | Freq: Four times a day (QID) | ORAL | Status: DC
  Administered 2013-01-19 – 2013-01-20 (×4): 100 mg via ORAL
  Filled 2013-01-19 (×4): qty 1

## 2013-01-19 MED ORDER — PSYLLIUM 95 % PO PACK
1.0000 | PACK | Freq: Every day | ORAL | Status: DC
  Filled 2013-01-19 (×3): qty 1

## 2013-01-19 MED ORDER — INSULIN ASPART 100 UNIT/ML ~~LOC~~ SOLN
3.0000 [IU] | Freq: Three times a day (TID) | SUBCUTANEOUS | Status: DC
  Administered 2013-01-19 – 2013-01-20 (×3): 3 [IU] via SUBCUTANEOUS

## 2013-01-19 MED ORDER — ATORVASTATIN CALCIUM 20 MG PO TABS
20.0000 mg | ORAL_TABLET | Freq: Every day | ORAL | Status: DC
  Administered 2013-01-19: 20 mg via ORAL
  Filled 2013-01-19: qty 1

## 2013-01-19 MED ORDER — INSULIN ASPART 100 UNIT/ML ~~LOC~~ SOLN
0.0000 [IU] | Freq: Three times a day (TID) | SUBCUTANEOUS | Status: DC
  Administered 2013-01-19: 2 [IU] via SUBCUTANEOUS
  Administered 2013-01-20: 1 [IU] via SUBCUTANEOUS

## 2013-01-19 NOTE — Progress Notes (Signed)
[facility] PROGRESS NOTE  Dorothy Carey ZOX:[id number] DOB: Jun 13, 1940 DOA: 01/18/2013 PCP: Estanislado Pandy, MD  Assessment/Plan: 1. Acute ischemic stroke: Small nonhemorrhagic infarcts noted. Discuss with radiology--these are believed to be ischemic in nature based on single territory. Continue therapy evaluations. Further recommendations per neurology.  2. Diabetes mellitus type 2: Continue sliding scale insulin. Continue Levemir. Blood sugars stable. 3. Hypertension: Permissive hypertension. Resume antihypertensives next one to 2 days. 4. History of seizure disorder: Stable. Phenytoin. 5. Irritable bowel: Stable.   Change phenytoin to oral  Followup neurology recommendations. Complete stroke evaluation.  Add statin, Plavix  Followup echocardiogram  Skilled nursing facility placement recommended  Pending studies:   Hemoglobin A1c  2-D echocardiogram  Code Status: full code DVT prophylaxis: Lovenox Family Communication: Discussed with family members at bedside Disposition Plan: Home when improved  Brendia Sacks, MD  [facility]  Pager [phone] If 7PM-7AM, please contact night-coverage at [email or web address], password [facility] 01/19/2013, 11:22 AM  LOS: 1 day   Summary: 72 year old woman presented to ED after falling this morning, noted slurred speech, facial droop, right leg weakness. Initial workup revealed stroke and patient was referred for admission.  Consultants:  Neurology  Occupational therapy: Home health  Speech therapy dysphagia 3 diet, thin liquid. Outpatient or skilled nursing facility speech-language pathology  Physical therapy: Skilled nursing facility  Procedures:  2-D echocardiogram:  Antibiotics:    HPI/Subjective: Feels better today. Right arm and right leg feel better. No paresthesias. Speech is without significant change. Swallowing secretions without difficulty. No new deficits.  Objective: Filed Vitals:   01/18/13 1900 01/18/13  2117 01/19/13 0152 01/19/13 0632  BP: 157/83 155/83 164/84 158/73  Pulse: 99 95 91 85  Temp: 98 F (36.7 C) 98.3 F (36.8 C) 97.7 F (36.5 C) 98.2 F (36.8 C)  TempSrc:  Oral Oral Oral  Resp: 20 18 19 19   Height:      Weight:      SpO2: 93% 96% 94% 97%    Intake/Output Summary (Last 24 hours) at 01/19/13 1122 Last data filed at 01/19/13 0700  Gross per 24 hour  Intake     60 ml  Output      0 ml  Net     60 ml     Filed Weights   01/18/13 0929  Weight: 77.111 kg (170 lb)    Exam:   Afebrile, vital signs stable. No hypoxia.  General: Appears calm and comfortable. Speech slightly dysarthric but easily understandable.  Neuro: Extraocular movements intact. Right facial droop noted. No change is seen today.  Musculoskeletal: Grossly normal tone and strength upper and lower extremities. Able to left both legs off the bed. Strength right side 4+/5 upper and lower extremities.  Cardiovascular: Regular rate and rhythm. No murmur, rub or gallop.  Respiratory: Clear to auscultation bilaterally. No wheezes, rales or rhonchi. Normal respiratory effort.  Data Reviewed:  Capillary blood sugars stable  Complete metabolic panel unremarkable  LDL 103  Chest x-ray unremarkable  Bilateral carotid ultrasound, mild plaque formation, less than 50% diameter stenosis bilateral arteries.  MRI of the brain revealed very small acute nonhemorrhagic infarcts, remote infarcts. Intracranial atherosclerotic type changes seen on MRA.  Scheduled Meds: . aspirin  300 mg Rectal Daily   Or  . aspirin  325 mg Oral Daily  . enoxaparin (LOVENOX) injection  40 mg Subcutaneous Q24H  . insulin aspart  0-9 Units Subcutaneous Q6H  . insulin detemir  30 Units Subcutaneous QHS  . pantoprazole (PROTONIX) IV  40 mg Intravenous Q24H  . phenytoin (DILANTIN) IV  100 mg Intravenous Q6H   Continuous Infusions:   Principal Problem:   CVA (cerebral infarction) Active Problems:   IBS (irritable bowel  syndrome)   Type II or unspecified type diabetes mellitus without mention of complication, not stated as uncontrolled   Seizure disorder   Stroke   Acute ischemic stroke   Time spent 20 minutes

## 2013-01-19 NOTE — Consult Note (Signed)
[facility] Dorothy Carey A. [name], MD     [email or web address]          Dorothy Carey is an 72 y.o. female.   ASSESSMENT/PLAN:  1. Acute left MCA infarct with 2 small infarcts involving the MCA distribution. The 2 small strokes along with her other stroke suggest the possibility of embolic phenomena although there is no indication of atrial fibrillation. This however could be thromboembolic phenomena. She also has had a couple of lacunar infarcts. Risk factors include age, hypertension and diabetes. The patient's antiplatelet agents have been switched around from aspirin to Plavix. We will go ahead and also add a statin such as Zocor. Additional risk factor reduction includes hypertension and diabetes control. The patient also has had an echocardiography which will be followed.  2. Seizure disorder well controlled although she has subtherapeutic Dilantin levels.  This is a 72 year old right-handed white female who presents with having a fall on yesterday. She also had another fall today after noticing that she had weakness involving the right side and also dysarthria. No dysphagia as reported. She presented to the hospital and imaging of the head with CT scan suggests a infarct on the contralateral side. The patient has been on aspirin previous at home. She does have significant history of hypertension and was quite hypertensive on presented to the hospital with a systolic of 200 and diastolic 664. The patient does not report chest pain or shortness of breath. She reports that the right side continues to be weak. She also complains of numbness and tingling on the right side. Review of systems otherwise negative. Infrequent sz, last several yrs ago.   GENERAL: This very pleasant overweight lady in no acute distress.  HEENT: This is normocephalic and neck is supple.  ABDOMEN: soft  EXTREMITIES: No edema   BACK: Unremarkable.  SKIN: Normal by inspection.    MENTAL STATUS: Alert and  oriented. Speech, language and cognition are generally intact. Judgment and insight normal.   CRANIAL NERVES: Pupils are equal, round and reactive to light and accommodation; extra ocular movements are full, there is no significant nystagmus; visual fields are full; upper and lower facial muscles are normal in strength and symmetric, there is no flattening of the nasolabial folds; tongue is midline; uvula is midline; shoulder elevation is normal.  MOTOR: There is a significant pronator drift involving the right upper extremity. Right deltoid 4+/5 triceps 4 and handgrip 4. Right hip flexion 4 minus and dorsiflexion 3/5. Tone is slightly reduced on the right. Bulk is normal. The left side shows normal tone, bulk and strength.  COORDINATION: Left finger to nose is normal, right finger to nose is normal, No rest tremor; no intention tremor; no postural tremor; no bradykinesia.  REFLEXES: Deep tendon reflexes are symmetrical and normal. Plantar responses are flexor bilaterally.   SENSATION: Reduced light touch on the right side.   The patient's brain MRI is reviewed in person. There is to small infarcts seen on diffusion imaging involving the left parietal deep white matter in the left insular cortex. The patient is noted to have old lacunar infarcts involving the right centrum semiovale. There are 2 located there. There is also an anterior watershed infarct on the left and a small cortical right parietal infarct. 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    Past Medical History  Diagnosis Date  . Ruptured disk   . Internal hemorrhoid   . Hematochezia   . Seizures   . Chronic diarrhea   .  IBS (irritable bowel syndrome) 10/15/2011  . Diabetes mellitus without complication   . Hypertension     Past Surgical History  Procedure Laterality Date  . Colonoscopy  06    ROURK  . Colonoscopy  04    FLEISHMAN  . Breast reduction surgery    . Foot surgery      Bone spurs  .  Abdominal hysterectomy    . Appendectomy    . Bladder tac    . Colonoscopy N/A 01/06/2013    Procedure: COLONOSCOPY;  Surgeon: Malissa Hippo, MD;  Location: [facility];  Service: Endoscopy;  Laterality: N/A;  100  . Cholecystectomy      Family History  Problem Relation Age of Onset  . COPD Mother   . Liver cancer Father     Social History:  reports that she has never smoked. She has never used smokeless tobacco. She reports that she does not drink alcohol or use illicit drugs.  Allergies: No Known Allergies  Medications: Prior to Admission medications   Medication Sig Start Date End Date Taking? Authorizing Provider  acetaminophen (TYLENOL) 500 MG tablet Take 500 mg by mouth as needed for pain.   Yes Historical Provider, MD  aspirin EC 81 MG tablet Take 81 mg by mouth daily.   Yes Historical Provider, MD  clobetasol cream (TEMOVATE) 0.05 % Apply topically 2 (two) times daily.   Yes Historical Provider, MD  hydrochlorothiazide (,MICROZIDE/HYDRODIURIL,) 12.5 MG capsule Take 12.5 mg by mouth daily.     Yes Historical Provider, MD  ibuprofen (ADVIL,MOTRIN) 100 MG tablet Take 200 mg by mouth every 6 (six) hours as needed for fever.   Yes Historical Provider, MD  Insulin Detemir (LEVEMIR FLEXPEN) 100 UNIT/ML SOPN Inject 70 Units into the skin at bedtime.   Yes Historical Provider, MD  insulin lispro (HUMALOG KWIKPEN) 100 UNIT/ML SOPN Inject 20 Units into the skin 3 (three) times daily with meals. Uses sliding scale: 90-150=20, 151-200=21, 201-250=22, 251-300=23, 301-350=24, 351-400=25, 400 or more=26.   Yes Historical Provider, MD  loperamide (IMODIUM) 2 MG capsule Take 2 mg by mouth 4 (four) times daily as needed for diarrhea or loose stools.    Yes Historical Provider, MD  loratadine (CLARITIN) 10 MG tablet Take 10 mg by mouth daily.   Yes Historical Provider, MD  pantoprazole (PROTONIX) 40 MG tablet Take 40 mg by mouth 2 (two) times daily.    Yes Historical Provider, MD  phenytoin  (DILANTIN) 100 MG ER capsule Take 400 mg by mouth daily.    Yes Historical Provider, MD  pravastatin (PRAVACHOL) 20 MG tablet Take 1 tablet (20 mg total) by mouth every evening. 11/10/12  Yes Jodelle Gross, NP  psyllium (METAMUCIL SMOOTH TEXTURE) 28 % packet Take 1 packet by mouth at bedtime. 12/14/12  Yes Malissa Hippo, MD    Scheduled Meds: . atorvastatin  20 mg Oral q1800  . clopidogrel  75 mg Oral Q breakfast  . enoxaparin (LOVENOX) injection  40 mg Subcutaneous Q24H  . insulin aspart  0-9 Units Subcutaneous TID WC  . insulin aspart  3 Units Subcutaneous TID WC  . insulin detemir  30 Units Subcutaneous QHS  . pantoprazole  40 mg Oral Daily  . phenytoin  100 mg Oral QID  . psyllium  1 packet Oral QHS   Continuous Infusions:  PRN Meds:.acetaminophen, acetaminophen, hydrALAZINE, LORazepam, ondansetron    Blood pressure 165/84, pulse 85, temperature 98.2 F (36.8 C), temperature source Oral, resp. rate 19, height  5' 3.5" (1.613 m), weight 77.111 kg (170 lb), SpO2 97.00%.   Results for orders placed during the hospital encounter of 01/18/13 (from the past 48 hour(s))  GLUCOSE, CAPILLARY     Status: Abnormal   Collection Time    01/18/13  9:30 AM      Result Value Range   Glucose-Capillary 171 (*) 70 - 99 mg/dL  CBC WITH DIFFERENTIAL     Status: None   Collection Time    01/18/13  9:33 AM      Result Value Range   WBC 7.2  4.0 - 10.5 K/uL   RBC 4.56  3.87 - 5.11 MIL/uL   Hemoglobin 13.4  12.0 - 15.0 g/dL   HCT 44.0  10.2 - 72.5 %   MCV 89.9  78.0 - 100.0 fL   MCH 29.4  26.0 - 34.0 pg   MCHC 32.7  30.0 - 36.0 g/dL   RDW 36.6  44.0 - 34.7 %   Platelets 294  150 - 400 K/uL   Neutrophils Relative % 68  43 - 77 %   Neutro Abs 5.0  1.7 - 7.7 K/uL   Lymphocytes Relative 16  12 - 46 %   Lymphs Abs 1.2  0.7 - 4.0 K/uL   Monocytes Relative 12  3 - 12 %   Monocytes Absolute 0.9  0.1 - 1.0 K/uL   Eosinophils Relative 3  0 - 5 %   Eosinophils Absolute 0.2  0.0 - 0.7 K/uL    Basophils Relative 1  0 - 1 %   Basophils Absolute 0.0  0.0 - 0.1 K/uL  COMPREHENSIVE METABOLIC PANEL     Status: Abnormal   Collection Time    01/18/13  9:33 AM      Result Value Range   Sodium 140  135 - 145 mEq/L   Potassium 3.7  3.5 - 5.1 mEq/L   Chloride 103  96 - 112 mEq/L   CO2 24  19 - 32 mEq/L   Glucose, Bld 188 (*) 70 - 99 mg/dL   BUN 18  6 - 23 mg/dL   Creatinine, Ser 4.25  0.50 - 1.10 mg/dL   Calcium 95.6  8.4 - 38.7 mg/dL   Total Protein 8.6 (*) 6.0 - 8.3 g/dL   Albumin 3.4 (*) 3.5 - 5.2 g/dL   AST 12  0 - 37 U/L   ALT 14  0 - 35 U/L   Alkaline Phosphatase 163 (*) 39 - 117 U/L   Total Bilirubin 0.3  0.3 - 1.2 mg/dL   GFR calc non Af Amer 81 (*) >90 mL/min   GFR calc Af Amer >90  >90 mL/min   Comment: (NOTE)     The eGFR has been calculated using the CKD EPI equation.     This calculation has not been validated in all clinical situations.     eGFR's persistently <90 mL/min signify possible Chronic Kidney     Disease.  PROTIME-INR     Status: None   Collection Time    01/18/13  9:33 AM      Result Value Range   Prothrombin Time 13.3  11.6 - 15.2 seconds   INR 1.03  0.00 - 1.49  PHENYTOIN LEVEL, TOTAL     Status: Abnormal   Collection Time    01/18/13  9:33 AM      Result Value Range   Phenytoin Lvl 5.2 (*) 10.0 - 20.0 ug/mL  GLUCOSE, CAPILLARY     Status: Abnormal  Collection Time    01/18/13  5:20 PM      Result Value Range   Glucose-Capillary 151 (*) 70 - 99 mg/dL   Comment 1 Notify RN    GLUCOSE, CAPILLARY     Status: Abnormal   Collection Time    01/18/13  9:22 PM      Result Value Range   Glucose-Capillary 137 (*) 70 - 99 mg/dL   Comment 1 Notify RN    GLUCOSE, CAPILLARY     Status: Abnormal   Collection Time    01/19/13 12:17 AM      Result Value Range   Glucose-Capillary 126 (*) 70 - 99 mg/dL   Comment 1 Notify RN    LIPID PANEL     Status: Abnormal   Collection Time    01/19/13  6:16 AM      Result Value Range   Cholesterol 176  0 - 200  mg/dL   Triglycerides 161 (*) <150 mg/dL   HDL 40  >09 mg/dL   Total CHOL/HDL Ratio 4.4     VLDL 33  0 - 40 mg/dL   LDL Cholesterol 604 (*) 0 - 99 mg/dL   Comment:            Total Cholesterol/HDL:CHD Risk     Coronary Heart Disease Risk Table                         Men   Women      1/2 Average Risk   3.4   3.3      Average Risk       5.0   4.4      2 X Average Risk   9.6   7.1      3 X Average Risk  23.4   11.0                Use the calculated Patient Ratio     above and the CHD Risk Table     to determine the patient's CHD Risk.                ATP III CLASSIFICATION (LDL):      <100     mg/dL   Optimal      540-981  mg/dL   Near or Above                        Optimal      130-159  mg/dL   Borderline      191-478  mg/dL   High      >295     mg/dL   Very High  COMPREHENSIVE METABOLIC PANEL     Status: Abnormal   Collection Time    01/19/13  6:16 AM      Result Value Range   Sodium 141  135 - 145 mEq/L   Potassium 3.9  3.5 - 5.1 mEq/L   Chloride 105  96 - 112 mEq/L   CO2 25  19 - 32 mEq/L   Glucose, Bld 142 (*) 70 - 99 mg/dL   BUN 18  6 - 23 mg/dL   Creatinine, Ser 6.21  0.50 - 1.10 mg/dL   Calcium 9.2  8.4 - 30.8 mg/dL   Total Protein 7.7  6.0 - 8.3 g/dL   Albumin 3.0 (*) 3.5 - 5.2 g/dL   AST 13  0 - 37 U/L   ALT 13  0 -  35 U/L   Alkaline Phosphatase 125 (*) 39 - 117 U/L   Total Bilirubin 0.4  0.3 - 1.2 mg/dL   GFR calc non Af Amer 67 (*) >90 mL/min   GFR calc Af Amer 78 (*) >90 mL/min   Comment: (NOTE)     The eGFR has been calculated using the CKD EPI equation.     This calculation has not been validated in all clinical situations.     eGFR's persistently <90 mL/min signify possible Chronic Kidney     Disease.  GLUCOSE, CAPILLARY     Status: Abnormal   Collection Time    01/19/13  6:30 AM      Result Value Range   Glucose-Capillary 138 (*) 70 - 99 mg/dL   Comment 1 Notify RN    GLUCOSE, CAPILLARY     Status: Abnormal   Collection Time    01/19/13 11:32  AM      Result Value Range   Glucose-Capillary 164 (*) 70 - 99 mg/dL   Comment 1 Notify RN      Dg Chest 2 View  01/18/2013   CLINICAL DATA:  Facial droop, slurred speech.  EXAM: CHEST  2 VIEW  COMPARISON:  September 15, 2010.  FINDINGS: Stable cardiomediastinal silhouette. No pneumothorax or pleural effusion is noted. Degenerative changes of lower thoracic spine are noted. No acute pulmonary disease is noted.  IMPRESSION: No acute cardiopulmonary abnormality seen.   Electronically Signed   By: Roque Lias M.D.   On: 01/18/2013 14:35   Ct Head Wo Contrast  01/18/2013   CLINICAL DATA:  Right hemiparesis with right-sided facial droop and fall x4 this morning. Code stroke.  EXAM: CT HEAD WITHOUT CONTRAST  TECHNIQUE: Contiguous axial images were obtained from the base of the skull through the vertex without intravenous contrast.  COMPARISON:  01/10/2004  FINDINGS: Ventricles, cisterns and other CSF spaces are within normal. There is evidence of chronic ischemic microvascular disease. There is a 2.5 x 3.2 cm region of low-attenuation over the gray and white matter of the left frontal lobe as cannot exclude subacute infarction. There is mild focal low-attenuation over the high right parietal gray and white matter likely an old small infarct. There is an ovoid 9 mm focus of low attenuation adjacent the right frontal horn likely chronic although cannot exclude focal subacute ischemic change. There is no midline shift and no evidence of acute hemorrhage. Bone soft tissues are within normal.  IMPRESSION: Region of low-attenuation over the left frontal lobe which may be chronic, although cannot exclude subacute ischemic change. Focal 8 mm region of low attenuation adjacent the right frontal horn likely chronic, although cannot exclude acute to subacute ischemic change. Evidence of an old small high right parietal infarct. No acute hemorrhage.  Chronic ischemic microvascular disease.  Critical Value/emergent results were  called by telephone at the time of interpretation on 01/18/2013 at 10:09 AM to Dr.JOSEPH ZAMMIT , who verbally acknowledged these results.   Electronically Signed   By: Elberta Fortis M.D.   On: 01/18/2013 10:09   Mr Shirlee Latch ZO Contrast  01/18/2013   ADDENDUM REPORT: 01/18/2013 17:36   Electronically Signed   By: Bridgett Larsson M.D.   On: 01/18/2013 17:36   01/18/2013   ADDENDUM REPORT: 01/18/2013 17:22   Electronically Signed   By: Bridgett Larsson M.D.   On: 01/18/2013 17:22   01/18/2013   CLINICAL DATA:  Right hemi paresis.  Right facial droop.  Fall.  EXAM: MRI  HEAD WITHOUT CONTRAST  MRA HEAD WITHOUT CONTRAST  TECHNIQUE: Multiplanar, multiecho pulse sequences of the brain and surrounding structures were obtained without intravenous contrast. Angiographic images of the head were obtained using MRA technique without contrast.  COMPARISON:  01/18/2013 CT.  No comparison MR.  FINDINGS: MRI HEAD FINDINGS  Very small acute nonhemorrhagic infarcts involving portions of the left subinsular region and left periatrial region.  Remote moderate size left frontal lobe infarct with encephalomalacia.  Remote small to slightly moderate size right parietal lobe infarct with encephalomalacia.  Remote right lenticular nucleus/caudate infarct with encephalomalacia.  Moderate small vessel disease type changes.  No intracranial hemorrhage.  Global atrophy. Ventricular prominence felt to be related to atrophy without hydrocephalus.  No intracranial mass lesion noted on this unenhanced exam.  Transverse ligament hypertrophy. Cervical medullary junction, pituitary region, pineal region and orbital structures unremarkable.  Minimal paranasal sinus mucosal thickening.  MRA HEAD FINDINGS  Exam is motion degraded.  Mild narrowing and irregularity cavernous segment internal carotid artery bilaterally.  Mild to moderate narrowing A1 segment right anterior cerebral artery.  Only a small number of left middle cerebral artery branches are noted  consistent with patient's acute and chronic infarcts.  Moderate narrowing and irregularity right middle cerebral artery branch vessels.  Left vertebral artery is dominant. Mild narrowing left vertebral artery. Mild to moderate narrowing right vertebral artery.  Non visualization right posterior inferior cerebellar artery.  Moderate narrowing portions of the left posterior inferior cerebellar artery.  Mild to slightly moderate narrowing mid aspect of the basilar artery.  Only small portion of the right anterior inferior cerebellar arteries visualize. Non visualization left anterior inferior cerebellar artery.  Poor delineation of a majority of the superior cerebellar artery bilaterally.  Moderate to marked narrowing of the mid to distal posterior cerebral artery branches more notable on the right.  No aneurysm noted.  IMPRESSION: Very small acute nonhemorrhagic infarcts involving portions of the left subinsular region and left periatrial region.  Remote infarcts as noted above.  Prominent intracranial atherosclerotic type changes as noted above. MR angiogram is motion degraded  These results will be called to the ordering clinician or representative by the Radiologist Assistant, and communication documented in the PACS Dashboard.  Electronically Signed: By: Bridgett Larsson M.D. On: 01/18/2013 13:58   Mr Brain Wo Contrast  01/18/2013   ADDENDUM REPORT: 01/18/2013 17:36   Electronically Signed   By: Bridgett Larsson M.D.   On: 01/18/2013 17:36   01/18/2013   ADDENDUM REPORT: 01/18/2013 17:22   Electronically Signed   By: Bridgett Larsson M.D.   On: 01/18/2013 17:22   01/18/2013   CLINICAL DATA:  Right hemi paresis.  Right facial droop.  Fall.  EXAM: MRI HEAD WITHOUT CONTRAST  MRA HEAD WITHOUT CONTRAST  TECHNIQUE: Multiplanar, multiecho pulse sequences of the brain and surrounding structures were obtained without intravenous contrast. Angiographic images of the head were obtained using MRA technique without contrast.   COMPARISON:  01/18/2013 CT.  No comparison MR.  FINDINGS: MRI HEAD FINDINGS  Very small acute nonhemorrhagic infarcts involving portions of the left subinsular region and left periatrial region.  Remote moderate size left frontal lobe infarct with encephalomalacia.  Remote small to slightly moderate size right parietal lobe infarct with encephalomalacia.  Remote right lenticular nucleus/caudate infarct with encephalomalacia.  Moderate small vessel disease type changes.  No intracranial hemorrhage.  Global atrophy. Ventricular prominence felt to be related to atrophy without hydrocephalus.  No intracranial mass lesion noted on this unenhanced  exam.  Transverse ligament hypertrophy. Cervical medullary junction, pituitary region, pineal region and orbital structures unremarkable.  Minimal paranasal sinus mucosal thickening.  MRA HEAD FINDINGS  Exam is motion degraded.  Mild narrowing and irregularity cavernous segment internal carotid artery bilaterally.  Mild to moderate narrowing A1 segment right anterior cerebral artery.  Only a small number of left middle cerebral artery branches are noted consistent with patient's acute and chronic infarcts.  Moderate narrowing and irregularity right middle cerebral artery branch vessels.  Left vertebral artery is dominant. Mild narrowing left vertebral artery. Mild to moderate narrowing right vertebral artery.  Non visualization right posterior inferior cerebellar artery.  Moderate narrowing portions of the left posterior inferior cerebellar artery.  Mild to slightly moderate narrowing mid aspect of the basilar artery.  Only small portion of the right anterior inferior cerebellar arteries visualize. Non visualization left anterior inferior cerebellar artery.  Poor delineation of a majority of the superior cerebellar artery bilaterally.  Moderate to marked narrowing of the mid to distal posterior cerebral artery branches more notable on the right.  No aneurysm noted.  IMPRESSION:  Very small acute nonhemorrhagic infarcts involving portions of the left subinsular region and left periatrial region.  Remote infarcts as noted above.  Prominent intracranial atherosclerotic type changes as noted above. MR angiogram is motion degraded  These results will be called to the ordering clinician or representative by the Radiologist Assistant, and communication documented in the PACS Dashboard.  Electronically Signed: By: Bridgett Larsson M.D. On: 01/18/2013 13:58   US Carotid Duplex Bilateral  01/18/2013   CLINICAL DATA:  Cerebrovascular accident.  EXAM: BILATERAL CAROTID DUPLEX ULTRASOUND  TECHNIQUE: Wallace Cullens scale imaging, color Doppler and duplex ultrasound were performed of bilateral carotid and vertebral arteries in the neck.  COMPARISON:  None.  FINDINGS: Criteria: Quantification of carotid stenosis is based on velocity parameters that correlate the residual internal carotid diameter with NASCET-based stenosis levels, using the diameter of the distal internal carotid lumen as the denominator for stenosis measurement.  The following velocity measurements were obtained:  RIGHT  ICA:  76/13 cm/sec  CCA:  84/10 cm/sec  SYSTOLIC ICA/CCA RATIO:  0.90  DIASTOLIC ICA/CCA RATIO:  1.29  ECA:  92 cm/sec  LEFT  ICA:  93/17 cm/sec  CCA:  84/9 cm/sec  SYSTOLIC ICA/CCA RATIO:  1.11  DIASTOLIC ICA/CCA RATIO:  2.02  ECA:  120 cm/sec  RIGHT CAROTID ARTERY: Mild calcified plaque is seen in the right carotid bulb and proximal right internal carotid artery consistent with less than 50% diameter stenosis based on Doppler criteria.  RIGHT VERTEBRAL ARTERY:  Antegrade flow is noted.  LEFT CAROTID ARTERY: Mild plaque formation is noted in the left carotid bulb and proximal left internal carotid artery consistent with less than 50% diameter stenosis based on Doppler criteria.  LEFT VERTEBRAL ARTERY:  Antegrade flow is noted  IMPRESSION: Mild plaque formation is seen involving both proximal internal carotid arteries consistent  with less than 50% diameter stenosis based on Doppler criteria.   Electronically Signed   By: Roque Lias M.D.   On: 01/18/2013 14:33        Bhakti Labella A. [name], M.D.  Diplomate, Biomedical engineer of Psychiatry and Neurology ( Neurology). 01/19/2013, 1:52 PM

## 2013-01-19 NOTE — Clinical Social Work Placement (Signed)
Clinical Social Work Department CLINICAL SOCIAL WORK PLACEMENT NOTE 01/19/2013  Patient:  Dorothy Carey, Dorothy Carey  Account Number:  [id number] Admit date:  01/18/2013  Clinical Social Worker:  Derenda Fennel, LCSW  Date/time:  01/19/2013 12:40 PM  Clinical Social Work is seeking post-discharge placement for this patient at the following level of care:   SKILLED NURSING   (*CSW will update this form in Epic as items are completed)   01/19/2013  Patient/family provided with [facility] Department of Clinical Social Work's list of facilities offering this level of care within the geographic area requested by the patient (or if unable, by the patient's family).  01/19/2013  Patient/family informed of their freedom to choose among providers that offer the needed level of care, that participate in Medicare, Medicaid or managed care program needed by the patient, have an available bed and are willing to accept the patient.  01/19/2013  Patient/family informed of [facility]' ownership interest in [facility], as well as of the fact that they are under no obligation to receive care at this facility.  PASARR submitted to EDS on 01/19/2013 PASARR number received from EDS on 01/19/2013  FL2 transmitted to all facilities in geographic area requested by pt/family on  01/19/2013 FL2 transmitted to all facilities within larger geographic area on   Patient informed that his/her managed care company has contracts with or will negotiate with  certain facilities, including the following:     Patient/family informed of bed offers received:   Patient chooses bed at  Physician recommends and patient chooses bed at    Patient to be transferred to  on   Patient to be transferred to facility by   The following physician request were entered in Epic:   Additional Comments:  Derenda Fennel, LCSW [phone]

## 2013-01-19 NOTE — Plan of Care (Signed)
Problem: Consults Goal: Ischemic Stroke Patient Education See Patient Education Module for education specifics.  Outcome: Progressing Gave stroke reference information from epic

## 2013-01-19 NOTE — Clinical Social Work Psychosocial (Signed)
Clinical Social Work Department BRIEF PSYCHOSOCIAL ASSESSMENT 01/19/2013  Patient:  Dorothy Carey, Dorothy Carey     Account Number:  [id number]     Admit date:  01/18/2013  Clinical Social Worker:  Nancie Neas  Date/Time:  01/19/2013 12:45 PM  Referred by:  CSW  Date Referred:  01/19/2013 Referred for  SNF Placement   Other Referral:   Interview type:  Patient Other interview type:   and children    PSYCHOSOCIAL DATA Living Status:  ALONE Admitted from facility:   Level of care:   Primary support name:  Onalee Hua Primary support relationship to patient:  CHILD, ADULT Degree of support available:   supportive    CURRENT CONCERNS Current Concerns  Post-Acute Placement   Other Concerns:    SOCIAL WORK ASSESSMENT / PLAN CSW met with pt and all three children at bedside. Pt alert and oriented and reports she was at home yesterday and fell. She called her son who brought her to ED. Pt admitted with a stroke. Pt lives alone and her son lives next door. Daughters live nearby. Children appear to be involved and supportive. Pt has history of seizures and has not driven in 20 years due to this. She is independent at baseline. PT and speech evaluated pt and recommendation is for SNF. CSW discussed placement process and provided SNF list. Pt aware of insurance authorization process and potential copays. Requesting [facility] or [facility] if possible.   Assessment/plan status:  Psychosocial Support/Ongoing Assessment of Needs Other assessment/ plan:   Information/referral to community resources:   SNF list    PATIENT'S/FAMILY'S RESPONSE TO PLAN OF CARE: Pt and children agreeable to initiate bed search. CSW will follow up with bed offers when available. Will begin insurance authorization with [employer].       Derenda Fennel, [place] [phone]

## 2013-01-19 NOTE — Evaluation (Signed)
Clinical/Bedside Swallow Evaluation  Patient Details  Name: Dorothy Carey MRN: [id number] Date of Birth: 1941-02-07  Today's Date: 01/19/2013 Time: 1010-1040 SLP Time Calculation (min): 30 min  Past Medical History:  Past Medical History  Diagnosis Date  . Ruptured disk   . Internal hemorrhoid   . Hematochezia   . Seizures   . Chronic diarrhea   . IBS (irritable bowel syndrome) 10/15/2011  . Diabetes mellitus without complication   . Hypertension    Past Surgical History:  Past Surgical History  Procedure Laterality Date  . Colonoscopy  06    ROURK  . Colonoscopy  04    FLEISHMAN  . Breast reduction surgery    . Foot surgery      Bone spurs  . Abdominal hysterectomy    . Appendectomy    . Bladder tac    . Colonoscopy N/A 01/06/2013    Procedure: COLONOSCOPY;  Surgeon: Malissa Hippo, MD;  [facility];  Service: Endoscopy;  Laterality: N/A;  100  . Cholecystectomy     HPI:  Mrs. Dorothy Carey is a 72 yo female who was admitted yesterday after falling at home, noted slurred speech, facial droop, right leg weakness. Pt failed RN swallow screen in ED and has been NPO since admission. Past medical history is significant for seizures, HTN, Diabetes Mellitus, and Irritable Bowel. She lives alone, but her son lives next door. She does not drive. MRI HEAD FINDINGS: Very small acute nonhemorrhagic infarcts involving portions of the left subinsular region and left periatrial region. Remote moderate size left frontal lobe infarct with encephalomalacia. Remote small to slightly moderate size right parietal lobe infarct with encephalomalacia. Remote right lenticular nucleus/caudate infarct with encephalomalacia. Moderate small vessel disease type changes. No intracranial hemorrhage. Global atrophy. Ventricular prominence felt to be related to atrophy without hydrocephalus.   Assessment / Plan / Recommendation Clinical Impression  Pt presents with mild right sided oral motor  weakness with reduced lingual ROM and coordination. She coughed after first sip of thin liquid, but exhibited no coughing/wet vocal quality over the duration of the evaluation even when challenged with taking meds whole with thin liquid. Pt with mild lingual residuals of solids to which she was unaware. Recommend D3/thin with supervision for all eating/drinking due to pt's decreased awareness of deficits. Above to pt/family and Charity fundraiser. SLP will follow while in patient. Speech/Language evaluation to follow.    Aspiration Risk  Mild    Diet Recommendation Dysphagia 3 (Mechanical Soft);Thin liquid   Liquid Administration via: Cup;Straw Medication Administration: Whole meds with liquid Supervision: Patient able to self feed;Full supervision/cueing for compensatory strategies Compensations: Slow rate;Check for pocketing;Follow solids with liquid Postural Changes and/or Swallow Maneuvers: Seated upright 90 degrees;Upright 30-60 min after meal    Other  Recommendations Oral Care Recommendations: Oral care BID Other Recommendations: Clarify dietary restrictions   Follow Up Recommendations  Outpatient SLP;Skilled Nursing facility;Inpatient Rehab;Home health SLP (pending PT recommendations)    Frequency and Duration min 2x/week  1 week        Swallow Study Prior Functional Status   Lives at home alone, son next door    General Date of Onset: 01/19/13 HPI: Mrs. Dorothy Carey is a 72 yo female who was admitted yesterday after falling at home, noted slurred speech, facial droop, right leg weakness. Pt failed RN swallow screen in ED and has been NPO since admission. Past medical history is significant for seizures, HTN, Diabetes Mellitus, and Irritable Bowel. She lives alone,  but her son lives next door. She does not drive. MRI HEAD FINDINGS: Very small acute nonhemorrhagic infarcts involving portions of the left subinsular region and left periatrial region. Remote moderate size left frontal lobe infarct  with encephalomalacia. Remote small to slightly moderate size right parietal lobe infarct with encephalomalacia. Remote right lenticular nucleus/caudate infarct with encephalomalacia. Moderate small vessel disease type changes. No intracranial hemorrhage. Global atrophy. Ventricular prominence felt to be related to atrophy without hydrocephalus. Type of Study: Bedside swallow evaluation Diet Prior to this Study: NPO Temperature Spikes Noted: No Respiratory Status: Room air History of Recent Intubation: No Behavior/Cognition: Alert;Cooperative;Pleasant mood;Requires cueing Oral Cavity - Dentition: Adequate natural dentition Self-Feeding Abilities: Needs set up Patient Positioning: Upright in bed Baseline Vocal Quality: Clear Volitional Cough: Strong Volitional Swallow: Able to elicit    Oral/Motor/Sensory Function Overall Oral Motor/Sensory Function: Impaired Labial ROM: Reduced right Labial Symmetry: Abnormal symmetry right Labial Strength: Reduced Labial Sensation: Within Functional Limits Lingual ROM: Reduced right;Reduced left Lingual Symmetry: Within Functional Limits Lingual Strength: Reduced Lingual Sensation: Reduced Facial ROM: Reduced right Facial Symmetry: Right drooping eyelid Facial Sensation: Within Functional Limits Velum: Impaired right Mandible: Within Functional Limits   Ice Chips Ice chips: Within functional limits Presentation: Spoon   Thin Liquid Thin Liquid: Impaired Presentation: Cup;Self Fed;Spoon;Straw Pharyngeal  Phase Impairments: Cough - Immediate (immediate cough x1 on first sip over 240 ml thin liquid)    Nectar Thick Nectar Thick Liquid: Not tested   Honey Thick Honey Thick Liquid: Not tested   Puree Puree: Within functional limits Presentation: Spoon   Solid   GO    Solid: Impaired Presentation: Self Fed Oral Phase Impairments: Reduced lingual movement/coordination Oral Phase Functional Implications: Oral residue      Thank  you,  Havery Moros, CCC-SLP [phone]  Dmari Schubring 01/19/2013,11:51 AM

## 2013-01-19 NOTE — Evaluation (Signed)
Speech Language Pathology Evaluation Patient Details Name: Dorothy Carey MRN: [id number] DOB: 1940/12/11 Today's Date: 01/19/2013 Time: 1031-1100 SLP Time Calculation (min): 29 min  Problem List:  Patient Active Problem List   Diagnosis Date Noted  . CVA (cerebral infarction) 01/18/2013  . Seizure disorder 01/18/2013  . Stroke 01/18/2013  . Acute ischemic stroke 01/18/2013  . Hypercholesterolemia 11/10/2012  . Diabetes mellitus without complication   . Hypertension   . Type II or unspecified type diabetes mellitus without mention of complication, not stated as uncontrolled 10/19/2012  . Shortness of breath 10/05/2012  . Chest pain 10/05/2012  . IBS (irritable bowel syndrome) 10/15/2011  . Diarrhea 10/15/2011   Past Medical History:  Past Medical History  Diagnosis Date  . Ruptured disk   . Internal hemorrhoid   . Hematochezia   . Seizures   . Chronic diarrhea   . IBS (irritable bowel syndrome) 10/15/2011  . Diabetes mellitus without complication   . Hypertension    Past Surgical History:  Past Surgical History  Procedure Laterality Date  . Colonoscopy  06    ROURK  . Colonoscopy  04    FLEISHMAN  . Breast reduction surgery    . Foot surgery      Bone spurs  . Abdominal hysterectomy    . Appendectomy    . Bladder tac    . Colonoscopy N/A 01/06/2013    Procedure: COLONOSCOPY;  Surgeon: Malissa Hippo, MD;  [facility];  Service: Endoscopy;  Laterality: N/A;  100  . Cholecystectomy     HPI:  Mrs. Dorothy Carey is a 72 yo female who was admitted yesterday after falling at home, noted slurred speech, facial droop, right leg weakness. Pt failed RN swallow screen in ED and has been NPO since admission. Past medical history is significant for seizures, HTN, Diabetes Mellitus, and Irritable Bowel. She lives alone, but her son lives next door. She does not drive. MRI HEAD FINDINGS: Very small acute nonhemorrhagic infarcts involving portions of the left subinsular  region and left periatrial region. Remote moderate size left frontal lobe infarct with encephalomalacia. Remote small to slightly moderate size right parietal lobe infarct with encephalomalacia. Remote right lenticular nucleus/caudate infarct with encephalomalacia. Moderate small vessel disease type changes. No intracranial hemorrhage. Global atrophy. Ventricular prominence felt to be related to atrophy without hydrocephalus.   Assessment / Plan / Recommendation Clinical Impression  Pt presents with mild/mod cognitive linguistic deficits characterized by reduced awareness, mild dysarthria with reduced intelligibility, mild expressive aphasia with semantic paraphasias, and visual/scanning impairment on right side. Pt states that she is back to her baseline and does not appear to be aware of current deficits. Continued cognitive linguistic therapy is strongly recommended in next level of care (SNF/Rehab vs home health or outpatient pending PT/OT recommendations) to address current speech/language, cognitive, and swallowing goals and maximize functional recovery.     SLP Assessment  Patient needs continued Speech Lanaguage Pathology Services    Follow Up Recommendations  Outpatient SLP;Skilled Nursing facility;Inpatient Rehab;Home health SLP    Frequency and Duration min 2x/week  1 week      SLP Goals  SLP Goals Potential to Achieve Goals: Good Progress/Goals/Alternative treatment plan discussed with pt/caregiver and they: Agree SLP Goal #1: Pt will demonstrate use of safety/call bell to express pressing needs with SLP present and min cues. SLP Goal #2: Pt will complete moderate level naming tasks with 80% acc and mod assist.  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)    Cognition  Overall Cognitive Status: Impaired/Different from baseline Arousal/Alertness: Awake/alert Orientation Level: Oriented X4 Attention: Selective Selective Attention: Impaired Selective Attention Impairment: Verbal complex;Functional complex Memory: Appears intact Awareness: Impaired Awareness Impairment: Intellectual impairment Problem Solving: Appears intact Executive Function: Self Monitoring;Self Correcting Self Monitoring: Impaired Self Monitoring Impairment: Verbal complex Self Correcting: Impaired Self Correcting Impairment: Verbal complex Behaviors: Perseveration Safety/Judgment: Impaired Comments:  (emerging awareness of deficits)    Comprehension  Auditory Comprehension Overall Auditory Comprehension: Impaired Yes/No Questions: Within Functional Limits Commands: Impaired Multistep Basic Commands: 50-74% accurate Conversation: Simple Interfering Components: Attention;Visual impairments EffectiveTechniques: Repetition;Visual/Gestural cues;Stressing words Visual Recognition/Discrimination Discrimination: Exceptions to [facility] Pictures: Able in field of 3 Reading Comprehension Reading Status: Impaired Word level: Within functional limits Sentence Level: Impaired Paragraph Level: Not tested Functional Environmental (signs, name badge): Not tested Interfering Components: Visual scanning;Right neglect/inattention;Visual acuity (needs further assessment) Effective Techniques: Large print;Eye glasses;Visual cueing;Verbal cueing    Expression Expression Primary Mode of Expression: Verbal Verbal Expression Overall Verbal Expression: Impaired Initiation: No impairment Automatic Speech:  (WFL) Level of Generative/Spontaneous Verbalization: Sentence Repetition:  (Not tested) Naming: Impairment Responsive: 51-75% accurate Confrontation: Impaired Pictures: Able in field of 3 Convergent: 50-74% accurate Divergent: Not tested Verbal Errors: Semantic  paraphasias;Perseveration;Not aware of errors Pragmatics: No impairment Interfering Components: Speech intelligibility Effective Techniques: Semantic cues;Phonemic cues;Sentence completion Non-Verbal Means of Communication: Not applicable Written Expression Dominant Hand: Right Written Expression: Not tested   Oral / Motor Oral Motor/Sensory Function Overall Oral Motor/Sensory Function: Impaired Labial ROM: Reduced right Labial Symmetry: Abnormal symmetry right Labial Strength: Reduced Labial Sensation: Within Functional Limits Lingual ROM: Reduced right;Reduced left Lingual Symmetry: Within Functional Limits Lingual Strength: Reduced Lingual Sensation: Reduced Facial ROM: Reduced right Facial Symmetry: Right drooping eyelid Facial Sensation: Within Functional Limits Velum: Impaired right Mandible: Within Functional Limits Motor Speech Overall Motor Speech: Impaired Respiration: Impaired Level of Impairment: Phrase Phonation: Normal Resonance: Within functional limits Articulation: Impaired Level of Impairment: Phrase Intelligibility: Intelligibility reduced Word: 75-100% accurate Phrase: 50-74% accurate Sentence: Not tested Conversation: 50-74% accurate Motor Planning: Witnin functional limits Motor Speech Errors: Unaware Effective Techniques: Slow rate;Increased vocal intensity;Over-articulate;Pause   GO    Thank you,  Havery Moros, CCC-SLP [phone]  PORTER,DABNEY 01/19/2013, 12:19 PM

## 2013-01-19 NOTE — Progress Notes (Signed)
*  PRELIMINARY RESULTS* Echocardiogram 2D Echocardiogram has been performed.  Dorothy Carey 01/19/2013, 11:10 AM

## 2013-01-19 NOTE — Evaluation (Signed)
Physical Therapy Evaluation Patient Details Name: Dorothy Carey MRN: [id number] DOB: 1940/07/03 Today's Date: 01/19/2013 Time: 1116-1201 PT Time Calculation (min): 45 min  PT Assessment / Plan / Recommendation History of Present Illness  Pt is admitted with right sided weakness, facial droop and slurred speech, several falls at home.  She is found to have small acute infarcts of the left subinsular and left periatrial regions.  She is stating that yesterday she was unable to use her left arm at all, now she is spontaneously using it.  Clinical Impression   Pt is seen for an evaluation.  She is in bed with very flat affect, trunk and head leaning to the left.  She does spontaneously use her RUE.  Pt had some difficulty following simple instructions and demonstrated weakness in both LEs, right weaker than left.  She had decreased coordination.  She was found to have poor peripheral vision to the right.  She needed only min to SBA for bed mobility, but now needs a walker to ambulate only 20' with assistance.  She has multiple gait deviations  As noted in eval and has decreased standing balance.  She should have good rehab potential and I would recommend that she go to SNF at d/c.  [facility] would also be appropriate for her if she was willing to leave this area.    PT Assessment  Patient needs continued PT services    Follow Up Recommendations  SNF    Does the patient have the potential to tolerate intense rehabilitation      Barriers to Discharge Decreased caregiver support      Equipment Recommendations  Rolling walker with 5" wheels    Recommendations for Other Services     Frequency Min 6X/week    Precautions / Restrictions Precautions Precautions: Fall Restrictions Weight Bearing Restrictions: No   Pertinent Vitals/Pain       Mobility  Bed Mobility Bed Mobility: Sit to Supine Rolling Right: 4: Min guard;With rail Supine to Sit: 4: Min guard;HOB elevated Sit to Supine: 4: Min  assist Details for Bed Mobility Assistance: transfer is very labored with supine to sit but was able to perform with no therapist manual assist...she did need min assist to lift RLE into the bed with sit to supine transfer Transfers Sit to Stand: 4: Min guard;From bed;With upper extremity assist Stand to Sit: With upper extremity assist;4: Min guard;To bed Ambulation/Gait Ambulation/Gait Assistance: 4: Min assist Ambulation Distance (Feet): 20 Feet Assistive device: Rolling walker Gait Pattern: Step-through pattern;Decreased step length - left;Decreased step length - right;Decreased stance time - right;Decreased hip/knee flexion - right;Decreased hip/knee flexion - left;Decreased trunk rotation;Narrow base of support Gait velocity: very slow and labored General Gait Details: tends to have depressed right shoulder during stance Stairs: No Wheelchair Mobility Wheelchair Mobility: No    Exercises     PT Diagnosis: Difficulty walking;Abnormality of gait;Hemiplegia dominant side  PT Problem List: Decreased strength;Decreased activity tolerance;Decreased balance;Decreased mobility;Decreased knowledge of use of DME;Decreased safety awareness;Cardiopulmonary status limiting activity PT Treatment Interventions: DME instruction;Gait training;Functional mobility training;Therapeutic exercise;Balance training     PT Goals(Current goals can be found in the care plan section) Acute Rehab PT Goals Patient Stated Goal: return home to independence PT Goal Formulation: With patient/family Time For Goal Achievement: 02/02/13 Potential to Achieve Goals: Good  Visit Information  Last PT Received On: 01/19/13       Prior Functioning       Cognition  Cognition Arousal/Alertness: Awake/alert    Extremity/Trunk  Assessment Lower Extremity Assessment Lower Extremity Assessment: RLE deficits/detail;LLE deficits/detail RLE Deficits / Details: strength generally2+/5 with fair to poor coordination RLE  Coordination: decreased gross motor LLE Deficits / Details: strength generally 3-/5 with fair coordination LLE Coordination: decreased gross motor Cervical / Trunk Assessment Cervical / Trunk Assessment: Kyphotic   Balance Balance Balance Assessed: Yes Static Sitting Balance Static Sitting - Balance Support: No upper extremity supported;Feet supported (independent, holds head laterally flexed to left) Dynamic Sitting Balance Dynamic Sitting - Balance Support: No upper extremity supported;Feet supported Dynamic Sitting - Level of Assistance: 5: Stand by assistance Reach (Patient is able to reach ___ inches to right, left, forward, back): able to reach 8" forward bilaterally but fall left, reaches to left and right about 6", able to lean backward about 3" Static Standing Balance Static Standing - Balance Support: No upper extremity supported Static Standing - Level of Assistance: 5: Stand by assistance Dynamic Standing Balance Dynamic Standing - Balance Support: No upper extremity supported Dynamic Standing - Level of Assistance: 4: Min assist  End of Session PT - End of Session Equipment Utilized During Treatment: Gait belt Activity Tolerance: Patient limited by fatigue Patient left: in bed;with call bell/phone within reach;with bed alarm set;with family/visitor present  GP     Myrlene Broker L 01/19/2013, 12:10 PM

## 2013-01-19 NOTE — Progress Notes (Signed)
INITIAL NUTRITION ASSESSMENT  DOCUMENTATION CODES Per approved criteria  -Not Applicable   INTERVENTION: Mechanical soft diet with thin liquids per ST Offer alternate meal selections as needed to maximize nutrition intake  NUTRITION DIAGNOSIS: None at this time  Goal: Pt to meet >/= 90% of their estimated nutrition needs   Monitor:  Diet advancement/tolerance, po meal  intake, labs and wt trends  Reason for Assessment: Malnutrition Screen Score = 3  72 y.o. female  Admitting Dx: CVA (cerebral infarction)  ASSESSMENT: Pt is an overweight 72 yo FM s/p acute ischemic stroke. Hx of IBS. Loose stools reported this morning. ST completed evaluation and advanced diet Mech Soft with thin liquids. Pt says appetite and po intake very good prior to admission.  Her daugther is present and also confirms information.  Patient Active Problem List   Diagnosis Date Noted  . CVA (cerebral infarction) 01/18/2013  . Seizure disorder 01/18/2013  . Stroke 01/18/2013  . Acute ischemic stroke 01/18/2013  . Hypercholesterolemia 11/10/2012  . Diabetes mellitus without complication   . Hypertension   . Type II or unspecified type diabetes mellitus without mention of complication, not stated as uncontrolled 10/19/2012  . Shortness of breath 10/05/2012  . Chest pain 10/05/2012  . IBS (irritable bowel syndrome) 10/15/2011  . Diarrhea 10/15/2011    Height: Ht Readings from Last 1 Encounters:  01/18/13 5' 3.5" (1.613 m)    Weight: Wt Readings from Last 1 Encounters:  01/18/13 170 lb (77.111 kg)    Ideal Body Weight: 115# (52.2 kg)  % Ideal Body Weight: 148%  Wt Readings from Last 10 Encounters:  01/18/13 170 lb (77.111 kg)  01/06/13 173 lb (78.472 kg)  01/06/13 173 lb (78.472 kg)  12/14/12 173 lb 3.2 oz (78.563 kg)  11/10/12 175 lb (79.379 kg)  10/19/12 174 lb 6.1 oz (79.1 kg)  10/05/12 177 lb (80.287 kg)  10/15/11 171 lb (77.565 kg)  10/15/10 162 lb (73.483 kg)    Usual Body  Weight: 173-177#  % Usual Body Weight: 98%  BMI:  Body mass index is 29.64 kg/(m^2).overweight  Estimated Nutritional Needs: Kcal: 1300-1560 Protein: 77-85 gr Fluid: >2000 ml daily  Skin: intact  Diet Order: NPO  EDUCATION NEEDS: -No education needs identified at this time   Intake/Output Summary (Last 24 hours) at 01/19/13 0940 Last data filed at 01/19/13 0700  Gross per 24 hour  Intake     60 ml  Output      0 ml  Net     60 ml    Last BM: 01/18/13  Labs:   Recent Labs Lab 01/18/13 0933 01/19/13 0616  NA 140 141  K 3.7 3.9  CL 103 105  CO2 24 25  BUN 18 18  CREATININE 0.79 0.84  CALCIUM 10.0 9.2  GLUCOSE 188* 142*    CBG (last 3)   Recent Labs  01/18/13 2122 01/19/13 0017 01/19/13 0630  GLUCAP 137* 126* 138*    Scheduled Meds: . aspirin  300 mg Rectal Daily   Or  . aspirin  325 mg Oral Daily  . enoxaparin (LOVENOX) injection  40 mg Subcutaneous Q24H  . insulin aspart  0-9 Units Subcutaneous Q6H  . insulin detemir  30 Units Subcutaneous QHS  . pantoprazole (PROTONIX) IV  40 mg Intravenous Q24H  . phenytoin (DILANTIN) IV  100 mg Intravenous Q6H    Continuous Infusions:   Past Medical History  Diagnosis Date  . Ruptured disk   . Internal hemorrhoid   .  Hematochezia   . Seizures   . Chronic diarrhea   . IBS (irritable bowel syndrome) 10/15/2011  . Diabetes mellitus without complication   . Hypertension     Past Surgical History  Procedure Laterality Date  . Colonoscopy  06    ROURK  . Colonoscopy  04    FLEISHMAN  . Breast reduction surgery    . Foot surgery      Bone spurs  . Abdominal hysterectomy    . Appendectomy    . Bladder tac    . Colonoscopy N/A 01/06/2013    Procedure: COLONOSCOPY;  Surgeon: Malissa Hippo, MD;  Location: [facility];  Service: Endoscopy;  Laterality: N/A;  100  . Cholecystectomy      Royann Shivers MS,RD,CSG,LDN Office: [phone] Pager: [phone]

## 2013-01-19 NOTE — Progress Notes (Signed)
Utilization Review Complete  

## 2013-01-20 DIAGNOSIS — E119 Type 2 diabetes mellitus without complications: Secondary | ICD-10-CM

## 2013-01-20 DIAGNOSIS — I6350 Cerebral infarction due to unspecified occlusion or stenosis of unspecified cerebral artery: Principal | ICD-10-CM

## 2013-01-20 DIAGNOSIS — I10 Essential (primary) hypertension: Secondary | ICD-10-CM

## 2013-01-20 DIAGNOSIS — G40909 Epilepsy, unspecified, not intractable, without status epilepticus: Secondary | ICD-10-CM

## 2013-01-20 LAB — GLUCOSE, CAPILLARY
Glucose-Capillary: 110 mg/dL — ABNORMAL HIGH (ref 70–99)
Glucose-Capillary: 149 mg/dL — ABNORMAL HIGH (ref 70–99)

## 2013-01-20 MED ORDER — CLOPIDOGREL BISULFATE 75 MG PO TABS: 75.0000 mg | ORAL_TABLET | Freq: Every day | ORAL | Status: DC

## 2013-01-20 MED ORDER — INSULIN DETEMIR 100 UNIT/ML FLEXPEN: 35.0000 [IU] | PEN_INJECTOR | Freq: Every day | SUBCUTANEOUS | Status: DC

## 2013-01-20 MED ORDER — ATORVASTATIN CALCIUM 20 MG PO TABS: 20.0000 mg | ORAL_TABLET | Freq: Every day | ORAL | Status: DC

## 2013-01-20 NOTE — Clinical Social Work Note (Signed)
CSW presented bed offer and pt chooses [facility]. D/C today. Family and facility also aware and agreeable. Facility received insurance authorization and daughter completed paperwork. Son to provide transport. D/C summary faxed.  Derenda Fennel, [place] [phone]

## 2013-01-20 NOTE — Clinical Social Work Placement (Signed)
Clinical Social Work Department CLINICAL SOCIAL WORK PLACEMENT NOTE 01/20/2013  Patient:  SAMYIAH, HALVORSEN  Account Number:  [id number] Admit date:  01/18/2013  Clinical Social Worker:  Derenda Fennel, LCSW  Date/time:  01/19/2013 12:40 PM  Clinical Social Work is seeking post-discharge placement for this patient at the following level of care:   SKILLED NURSING   (*CSW will update this form in Epic as items are completed)   01/19/2013  Patient/family provided with [facility] Department of Clinical Social Work's list of facilities offering this level of care within the geographic area requested by the patient (or if unable, by the patient's family).  01/19/2013  Patient/family informed of their freedom to choose among providers that offer the needed level of care, that participate in Medicare, Medicaid or managed care program needed by the patient, have an available bed and are willing to accept the patient.  01/19/2013  Patient/family informed of [facility]' ownership interest in [facility], as well as of the fact that they are under no obligation to receive care at this facility.  PASARR submitted to EDS on 01/19/2013 PASARR number received from EDS on 01/19/2013  FL2 transmitted to all facilities in geographic area requested by pt/family on  01/19/2013 FL2 transmitted to all facilities within larger geographic area on   Patient informed that his/her managed care company has contracts with or will negotiate with  certain facilities, including the following:     Patient/family informed of bed offers received:  01/20/2013 Patient chooses bed at [facility] Physician recommends and patient chooses bed at  [facility]  Patient to be transferred to [facility] on  01/20/2013 Patient to be transferred to facility by family  The following physician request were entered in Epic:   Additional Comments:  Derenda Fennel, LCSW [phone]

## 2013-01-20 NOTE — Progress Notes (Signed)
Physical Therapy Treatment Patient Details Name: Dorothy Carey MRN: [id number] DOB: March 13, 1940 Today's Date: 01/20/2013 Time: 4098-1191 PT Time Calculation (min): 28 min\ 1 GT 1 TE  PT Assessment / Plan / Recommendation       Patient improved today with all mobility; able to transfer supine<>sit with MI, all bed exercises completed equally bilaterally and only required one rest break. Patient was also able to complete a total of 21' of gait training with RW/Min A (30' + 16') seated rest break after first distance required due to fatigue and shortness of breath; 02sats 95% HR 103, education of pursed lip breathing quickly returned patient to 98% and HR of 96. Patient ambulated again with RW to restroom demonstrating good balance; supervision  While performing pericare.                                      Progress towards PT Goals Progress towards PT goals: Progressing toward goals  Plan      Precautions / Restrictions Restrictions Weight Bearing Restrictions: No       Mobility  Bed Mobility Sit to Supine: 6: Modified independent (Device/Increase time) Transfers Transfers: Sit to Stand;Stand to Sit Sit to Stand: With upper extremity assist;From toilet;4: Min guard Stand to Sit: 4: Min guard;To toilet;With armrests Ambulation/Gait Ambulation/Gait Assistance: 4: Min assist Ambulation Distance (Feet): 46 Feet (30' then seated rest and SOB;followed by 16') Assistive device: Rolling walker Gait velocity: slow and labored by completion    Exercises General Exercises - Lower Extremity Ankle Circles/Pumps: Both;10 reps;AROM;Supine Quad Sets: AROM;Both;10 reps;Supine Gluteal Sets: AROM;Both;10 reps;Supine Long Arc Quad: AROM;Both;10 reps;Seated Heel Slides: AROM;Both;Supine;10 reps Hip ABduction/ADduction: AROM;Both;10 reps;Supine Straight Leg Raises: AROM;Both;10 reps;Seated Hip Flexion/Marching: Standing;AROM;Both;5 reps   PT Diagnosis:    PT Problem List:   PT Treatment  Interventions:     PT Goals (current goals can now be found in the care plan section)    Visit Information  Last PT Received On: 01/20/13 Assistance Needed: +1                   End of Session PT - End of Session Equipment Utilized During Treatment: Gait belt Activity Tolerance: Patient tolerated treatment well Patient left: in bed;with family/visitor present;with bed alarm set Nurse Communication: Mobility status   GP     Shermaine Rivet ATKINSO 01/20/2013, 12:02 PM

## 2013-01-20 NOTE — Discharge Planning (Signed)
Pt report called to [employer] [facility] [employer], LPN).  Pt will travels to home by private vehicle.

## 2013-01-20 NOTE — Discharge Summary (Signed)
PATIENT DETAILS Name: Dorothy Carey Age: 72 y.o. Sex: female Date of Birth: Oct 03, 1940 MRN: [id number]. Admit Date: 01/18/2013 Admitting Physician: Standley Brooking, MD WUJ:WJXBJY,NWGN W, MD  Recommendations for Outpatient Follow-up:  1. Will need aggressive risk factor modification. Please recheck A1c and LDL panel   PRIMARY DISCHARGE DIAGNOSIS:  Principal Problem:  Acute CVA (cerebral infarction) Active Problems:   IBS (irritable bowel syndrome)   Type II or unspecified type diabetes mellitus without mention of complication, not stated as uncontrolled   Seizure disorder   Stroke   Acute ischemic stroke      PAST MEDICAL HISTORY: Past Medical History  Diagnosis Date  . Ruptured disk   . Internal hemorrhoid   . Hematochezia   . Seizures   . Chronic diarrhea   . IBS (irritable bowel syndrome) 10/15/2011  . Diabetes mellitus without complication   . Hypertension     DISCHARGE MEDICATIONS:   Medication List    STOP taking these medications       aspirin EC 81 MG tablet     ibuprofen 100 MG tablet  Commonly known as:  ADVIL,MOTRIN     pravastatin 20 MG tablet  Commonly known as:  PRAVACHOL      TAKE these medications       acetaminophen 500 MG tablet  Commonly known as:  TYLENOL  Take 500 mg by mouth as needed for pain.     atorvastatin 20 MG tablet  Commonly known as:  LIPITOR  Take 1 tablet (20 mg total) by mouth daily at 6 PM.     clobetasol cream 0.05 %  Commonly known as:  TEMOVATE  Apply topically 2 (two) times daily.     clopidogrel 75 MG tablet  Commonly known as:  PLAVIX  Take 1 tablet (75 mg total) by mouth daily with breakfast.     HUMALOG KWIKPEN 100 UNIT/ML Sopn  Generic drug:  insulin lispro  Inject 20 Units into the skin 3 (three) times daily with meals. Uses sliding scale: 90-150=20, 151-200=21, 201-250=22, 251-300=23, 301-350=24, 351-400=25, 400 or more=26.     hydrochlorothiazide 12.5 MG capsule  Commonly known as:  MICROZIDE  Take  12.5 mg by mouth daily.     Insulin Detemir 100 UNIT/ML Sopn  Commonly known as:  LEVEMIR FLEXPEN  Inject 35 Units into the skin at bedtime.     loperamide 2 MG capsule  Commonly known as:  IMODIUM  Take 2 mg by mouth 4 (four) times daily as needed for diarrhea or loose stools.     loratadine 10 MG tablet  Commonly known as:  CLARITIN  Take 10 mg by mouth daily.     pantoprazole 40 MG tablet  Commonly known as:  PROTONIX  Take 40 mg by mouth 2 (two) times daily.     phenytoin 100 MG ER capsule  Commonly known as:  DILANTIN  Take 400 mg by mouth daily.     psyllium 28 % packet  Commonly known as:  METAMUCIL SMOOTH TEXTURE  Take 1 packet by mouth at bedtime.        ALLERGIES:  No Known Allergies  BRIEF HPI:  See H&P, Labs, Consult and Test reports for all details in brief,72 year old woman presented to ED after falling this morning, noted slurred speech, facial droop, right leg weakness. Initial workup revealed stroke and patient was referred for admission.  CONSULTATIONS:   neurology  PERTINENT RADIOLOGIC STUDIES: Dg Chest 2 View  01/18/2013   CLINICAL DATA:  Facial  droop, slurred speech.  EXAM: CHEST  2 VIEW  COMPARISON:  September 15, 2010.  FINDINGS: Stable cardiomediastinal silhouette. No pneumothorax or pleural effusion is noted. Degenerative changes of lower thoracic spine are noted. No acute pulmonary disease is noted.  IMPRESSION: No acute cardiopulmonary abnormality seen.   Electronically Signed   By: Roque Lias M.D.   On: 01/18/2013 14:35   Ct Head Wo Contrast  01/18/2013   CLINICAL DATA:  Right hemiparesis with right-sided facial droop and fall x4 this morning. Code stroke.  EXAM: CT HEAD WITHOUT CONTRAST  TECHNIQUE: Contiguous axial images were obtained from the base of the skull through the vertex without intravenous contrast.  COMPARISON:  01/10/2004  FINDINGS: Ventricles, cisterns and other CSF spaces are within normal. There is evidence of chronic ischemic  microvascular disease. There is a 2.5 x 3.2 cm region of low-attenuation over the gray and white matter of the left frontal lobe as cannot exclude subacute infarction. There is mild focal low-attenuation over the high right parietal gray and white matter likely an old small infarct. There is an ovoid 9 mm focus of low attenuation adjacent the right frontal horn likely chronic although cannot exclude focal subacute ischemic change. There is no midline shift and no evidence of acute hemorrhage. Bone soft tissues are within normal.  IMPRESSION: Region of low-attenuation over the left frontal lobe which may be chronic, although cannot exclude subacute ischemic change. Focal 8 mm region of low attenuation adjacent the right frontal horn likely chronic, although cannot exclude acute to subacute ischemic change. Evidence of an old small high right parietal infarct. No acute hemorrhage.  Chronic ischemic microvascular disease.  Critical Value/emergent results were called by telephone at the time of interpretation on 01/18/2013 at 10:09 AM to Dr.JOSEPH ZAMMIT , who verbally acknowledged these results.   Electronically Signed   By: Elberta Fortis M.D.   On: 01/18/2013 10:09   Mr Shirlee Latch WJ Contrast  01/18/2013   ADDENDUM REPORT: 01/18/2013 17:36   Electronically Signed   By: Bridgett Larsson M.D.   On: 01/18/2013 17:36   01/18/2013   ADDENDUM REPORT: 01/18/2013 17:22   Electronically Signed   By: Bridgett Larsson M.D.   On: 01/18/2013 17:22   01/18/2013   CLINICAL DATA:  Right hemi paresis.  Right facial droop.  Fall.  EXAM: MRI HEAD WITHOUT CONTRAST  MRA HEAD WITHOUT CONTRAST  TECHNIQUE: Multiplanar, multiecho pulse sequences of the brain and surrounding structures were obtained without intravenous contrast. Angiographic images of the head were obtained using MRA technique without contrast.  COMPARISON:  01/18/2013 CT.  No comparison MR.  FINDINGS: MRI HEAD FINDINGS  Very small acute nonhemorrhagic infarcts involving portions of  the left subinsular region and left periatrial region.  Remote moderate size left frontal lobe infarct with encephalomalacia.  Remote small to slightly moderate size right parietal lobe infarct with encephalomalacia.  Remote right lenticular nucleus/caudate infarct with encephalomalacia.  Moderate small vessel disease type changes.  No intracranial hemorrhage.  Global atrophy. Ventricular prominence felt to be related to atrophy without hydrocephalus.  No intracranial mass lesion noted on this unenhanced exam.  Transverse ligament hypertrophy. Cervical medullary junction, pituitary region, pineal region and orbital structures unremarkable.  Minimal paranasal sinus mucosal thickening.  MRA HEAD FINDINGS  Exam is motion degraded.  Mild narrowing and irregularity cavernous segment internal carotid artery bilaterally.  Mild to moderate narrowing A1 segment right anterior cerebral artery.  Only a small number of left middle cerebral artery branches  are noted consistent with patient's acute and chronic infarcts.  Moderate narrowing and irregularity right middle cerebral artery branch vessels.  Left vertebral artery is dominant. Mild narrowing left vertebral artery. Mild to moderate narrowing right vertebral artery.  Non visualization right posterior inferior cerebellar artery.  Moderate narrowing portions of the left posterior inferior cerebellar artery.  Mild to slightly moderate narrowing mid aspect of the basilar artery.  Only small portion of the right anterior inferior cerebellar arteries visualize. Non visualization left anterior inferior cerebellar artery.  Poor delineation of a majority of the superior cerebellar artery bilaterally.  Moderate to marked narrowing of the mid to distal posterior cerebral artery branches more notable on the right.  No aneurysm noted.  IMPRESSION: Very small acute nonhemorrhagic infarcts involving portions of the left subinsular region and left periatrial region.  Remote infarcts as  noted above.  Prominent intracranial atherosclerotic type changes as noted above. MR angiogram is motion degraded  These results will be called to the ordering clinician or representative by the Radiologist Assistant, and communication documented in the PACS Dashboard.  Electronically Signed: By: Bridgett Larsson M.D. On: 01/18/2013 13:58   Mr Brain Wo Contrast  01/18/2013   ADDENDUM REPORT: 01/18/2013 17:36   Electronically Signed   By: Bridgett Larsson M.D.   On: 01/18/2013 17:36   01/18/2013   ADDENDUM REPORT: 01/18/2013 17:22   Electronically Signed   By: Bridgett Larsson M.D.   On: 01/18/2013 17:22   01/18/2013   CLINICAL DATA:  Right hemi paresis.  Right facial droop.  Fall.  EXAM: MRI HEAD WITHOUT CONTRAST  MRA HEAD WITHOUT CONTRAST  TECHNIQUE: Multiplanar, multiecho pulse sequences of the brain and surrounding structures were obtained without intravenous contrast. Angiographic images of the head were obtained using MRA technique without contrast.  COMPARISON:  01/18/2013 CT.  No comparison MR.  FINDINGS: MRI HEAD FINDINGS  Very small acute nonhemorrhagic infarcts involving portions of the left subinsular region and left periatrial region.  Remote moderate size left frontal lobe infarct with encephalomalacia.  Remote small to slightly moderate size right parietal lobe infarct with encephalomalacia.  Remote right lenticular nucleus/caudate infarct with encephalomalacia.  Moderate small vessel disease type changes.  No intracranial hemorrhage.  Global atrophy. Ventricular prominence felt to be related to atrophy without hydrocephalus.  No intracranial mass lesion noted on this unenhanced exam.  Transverse ligament hypertrophy. Cervical medullary junction, pituitary region, pineal region and orbital structures unremarkable.  Minimal paranasal sinus mucosal thickening.  MRA HEAD FINDINGS  Exam is motion degraded.  Mild narrowing and irregularity cavernous segment internal carotid artery bilaterally.  Mild to moderate  narrowing A1 segment right anterior cerebral artery.  Only a small number of left middle cerebral artery branches are noted consistent with patient's acute and chronic infarcts.  Moderate narrowing and irregularity right middle cerebral artery branch vessels.  Left vertebral artery is dominant. Mild narrowing left vertebral artery. Mild to moderate narrowing right vertebral artery.  Non visualization right posterior inferior cerebellar artery.  Moderate narrowing portions of the left posterior inferior cerebellar artery.  Mild to slightly moderate narrowing mid aspect of the basilar artery.  Only small portion of the right anterior inferior cerebellar arteries visualize. Non visualization left anterior inferior cerebellar artery.  Poor delineation of a majority of the superior cerebellar artery bilaterally.  Moderate to marked narrowing of the mid to distal posterior cerebral artery branches more notable on the right.  No aneurysm noted.  IMPRESSION: Very small acute nonhemorrhagic infarcts involving portions  of the left subinsular region and left periatrial region.  Remote infarcts as noted above.  Prominent intracranial atherosclerotic type changes as noted above. MR angiogram is motion degraded  These results will be called to the ordering clinician or representative by the Radiologist Assistant, and communication documented in the PACS Dashboard.  Electronically Signed: By: Bridgett Larsson M.D. On: 01/18/2013 13:58   US Carotid Duplex Bilateral  01/18/2013   CLINICAL DATA:  Cerebrovascular accident.  EXAM: BILATERAL CAROTID DUPLEX ULTRASOUND  TECHNIQUE: Wallace Cullens scale imaging, color Doppler and duplex ultrasound were performed of bilateral carotid and vertebral arteries in the neck.  COMPARISON:  None.  FINDINGS: Criteria: Quantification of carotid stenosis is based on velocity parameters that correlate the residual internal carotid diameter with NASCET-based stenosis levels, using the diameter of the distal  internal carotid lumen as the denominator for stenosis measurement.  The following velocity measurements were obtained:  RIGHT  ICA:  76/13 cm/sec  CCA:  84/10 cm/sec  SYSTOLIC ICA/CCA RATIO:  0.90  DIASTOLIC ICA/CCA RATIO:  1.29  ECA:  92 cm/sec  LEFT  ICA:  93/17 cm/sec  CCA:  84/9 cm/sec  SYSTOLIC ICA/CCA RATIO:  1.11  DIASTOLIC ICA/CCA RATIO:  2.02  ECA:  120 cm/sec  RIGHT CAROTID ARTERY: Mild calcified plaque is seen in the right carotid bulb and proximal right internal carotid artery consistent with less than 50% diameter stenosis based on Doppler criteria.  RIGHT VERTEBRAL ARTERY:  Antegrade flow is noted.  LEFT CAROTID ARTERY: Mild plaque formation is noted in the left carotid bulb and proximal left internal carotid artery consistent with less than 50% diameter stenosis based on Doppler criteria.  LEFT VERTEBRAL ARTERY:  Antegrade flow is noted  IMPRESSION: Mild plaque formation is seen involving both proximal internal carotid arteries consistent with less than 50% diameter stenosis based on Doppler criteria.   Electronically Signed   By: Roque Lias M.D.   On: 01/18/2013 14:33     PERTINENT LAB RESULTS: CBC:  Recent Labs  01/18/13 0933  WBC 7.2  HGB 13.4  HCT 41.0  PLT 294   CMET CMP     Component Value Date/Time   NA 141 01/19/2013 0616   NA 138 11/30/2007   K 3.9 01/19/2013 0616   CL 105 01/19/2013 0616   CO2 25 01/19/2013 0616   GLUCOSE 142* 01/19/2013 0616   BUN 18 01/19/2013 0616   CREATININE 0.84 01/19/2013 0616   CREATININE 0.7 11/30/2007   CALCIUM 9.2 01/19/2013 0616   PROT 7.7 01/19/2013 0616   ALBUMIN 3.0* 01/19/2013 0616   AST 13 01/19/2013 0616   ALT 13 01/19/2013 0616   ALKPHOS 125* 01/19/2013 0616   BILITOT 0.4 01/19/2013 0616   GFRNONAA 67* 01/19/2013 0616   GFRAA 78* 01/19/2013 0616    GFR Estimated Creatinine Clearance: 59.3 ml/min (by C-G formula based on Cr of 0.84). No results found for this basename: LIPASE, AMYLASE,  in the last 72 hours No results found for  this basename: CKTOTAL, CKMB, CKMBINDEX, TROPONINI,  in the last 72 hours No components found with this basename: POCBNP,  No results found for this basename: DDIMER,  in the last 72 hours  Recent Labs  01/19/13 0616  HGBA1C 6.6*    Recent Labs  01/19/13 0616  CHOL 176  HDL 40  LDLCALC 103*  TRIG 163*  CHOLHDL 4.4   No results found for this basename: TSH, T4TOTAL, FREET3, T3FREE, THYROIDAB,  in the last 72 hours No results found for  this basename: VITAMINB12, FOLATE, FERRITIN, TIBC, IRON, RETICCTPCT,  in the last 72 hours Coags:  Recent Labs  01/18/13 0933  INR 1.03   Microbiology: No results found for this or any previous visit (from the past 240 hour(s)).   BRIEF HOSPITAL COURSE:   Principal Problem: Acute ischemic stroke: - Further workup on admission revealed a acute ischemic CVA. Patient underwent a MRI of the brain, MRA of the brain, carotid Doppler and 2-D echocardiogram. Neurology was consulted, since she was already on aspirin, we switched over to Plavix. Speech therapy and physical therapy were consulted, current recommendations from speech therapy for a dysphagia 3 diet, recommendations from physical therapy or a skilled nursing facility placement for further rehabilitation.  - Further workup revealed the following 1. MRI brain:Very small acute nonhemorrhagic infarcts involving portions of the left subinsular region and left periatrial region. 2. MRA Brain: Prominent intracranial atherosclerotic type changes as noted above. (See above) 3. A 2-D echocardiogram:MIld LVHwith LVEF approximately 50-55%, grade 1 diastolic dysfunction with septal dyssynergy. Mild left atrialenlargement. MAC with mild mitral regurgitation. Unable toassess PASP, CVP appears normal range. No obvious PFO or ASD. 4.Carotid Doppler :Mild plaque formation is seen involving both proximal internal carotid arteries consistent with less than 50% diameter stenosis based on Doppler  criteria. 5.hemoglobin A1c: 6.6  6.HDL: 103   Diabetes mellitus type 2: - Continue sliding scale insulin. Continue Levemir- sugars have been stable at 30 units each bedtime (previous home dose was 70 units each bedtime). Blood sugars stable throughout this hospitalization.  Hypertension:  Permissive hypertension was allowed on admission. Resume chlorothiazide discharge  History of seizure disorder:  -Stable. Phenytoin.   Irritable bowel: - Stable.  TODAY-DAY OF DISCHARGE:  Subjective:   Dorothy Carey today has no headache,no chest abdominal pain,no new weakness tingling or numbness, feels much better wants to go home today. Per family at bedside, her speech is very close to her usual baseline. No new deficits.  Objective:   Blood pressure 147/75, pulse 87, temperature 98.2 F (36.8 C), temperature source Oral, resp. rate 20, height 5' 3.5" (1.613 m), weight 77.111 kg (170 lb), SpO2 97.00%. No intake or output data in the 24 hours ending 01/20/13 1047 Filed Weights   01/18/13 0929  Weight: 77.111 kg (170 lb)    Exam Awake Alert, Oriented *3, No new F.N deficits, Normal affect [place].AT,PERRAL Supple Neck,No JVD, No cervical lymphadenopathy appriciated.  Symmetrical Chest wall movement, Good air movement bilaterally, CTAB RRR,No Gallops,Rubs or new Murmurs, No Parasternal Heave +ve B.Sounds, Abd Soft, Non tender, No organomegaly appriciated, No rebound -guarding or rigidity. No Cyanosis, Clubbing or edema, No new Rash or bruise Very minimal right-sided weakness  DISCHARGE CONDITION: Stable  DISPOSITION: SNF  DISCHARGE INSTRUCTIONS:    Activity:  As tolerated with Full fall precautions use walker/cane & assistance as needed  Diet recommendation: Diabetic Diet Heart Healthy diet Dysphagia 3 diet with thin liquids      Discharge Orders   Future Orders Complete By Expires   Diet - low sodium heart healthy  As directed    Scheduling Instructions:     Dysphagia 3 diet    Increase activity slowly  As directed       Follow-up Information   Follow up with Estanislado Pandy, MD. Schedule an appointment as soon as possible for a visit in 2 weeks. (After discharge from skilled nursing facility)    Specialty:  Cardiology   Contact information:   [address] [place] [address]  [phone]       Follow up with NIDA,GEBRESELASSIE, MD. Schedule an appointment as soon as possible for a visit in 2 weeks. (After discharge from skilled nursing facility)    Specialty:  Endocrinology   Contact information:   [address] [place] [address] [phone]      Total Time spent on discharge equals 45 minutes.  SignedJeoffrey Massed 01/20/2013 10:47 AM

## 2013-06-08 ENCOUNTER — Encounter: Payer: Self-pay | Admitting: Adult Health

## 2013-06-08 ENCOUNTER — Ambulatory Visit (INDEPENDENT_AMBULATORY_CARE_PROVIDER_SITE_OTHER): Payer: Medicare Other | Admitting: Adult Health

## 2013-06-08 VITALS — BP 160/51 | HR 78 | Ht 63.0 in | Wt 158.0 lb

## 2013-06-08 DIAGNOSIS — R0602 Shortness of breath: Secondary | ICD-10-CM

## 2013-06-08 DIAGNOSIS — I10 Essential (primary) hypertension: Secondary | ICD-10-CM

## 2013-06-08 DIAGNOSIS — E7800 Pure hypercholesterolemia, unspecified: Secondary | ICD-10-CM

## 2013-06-08 MED ORDER — AMLODIPINE BESYLATE 5 MG PO TABS: 5.0000 mg | ORAL_TABLET | Freq: Every day | ORAL | Status: DC

## 2013-06-08 MED ORDER — PRAVASTATIN SODIUM 40 MG PO TABS: 40.0000 mg | ORAL_TABLET | Freq: Every day | ORAL | Status: DC

## 2013-06-08 NOTE — Progress Notes (Deleted)
Name: Dorothy Carey    DOB: [birth year]  Age: 73 y.o.  MR#: [id number]       PCP:  Manon Hilding, MD      Insurance: Payor: [employer] / Plan: BLUE MEDICARE / Product Type: *No Product type* /   CC:    Chief Complaint  Patient presents with  . Hypertension    VS Filed Vitals:   06/08/13 1429  BP: 160/51  Pulse: 78  Height: 5\' 3"  (1.6 m)  Weight: 158 lb (71.668 kg)  SpO2: 100%    Weights Current Weight  06/08/13 158 lb (71.668 kg)  01/18/13 170 lb (77.111 kg)  01/06/13 173 lb (78.472 kg)    Blood Pressure  BP Readings from Last 3 Encounters:  06/08/13 160/51  01/20/13 147/85  01/06/13 142/78     Admit date:  (Not on file) Last encounter with RMR:  Visit date not found   Allergy Review of patient's allergies indicates no known allergies.  Current Outpatient Prescriptions  Medication Sig Dispense Refill  . acetaminophen (TYLENOL) 500 MG tablet Take 500 mg by mouth as needed for pain.      . clobetasol cream (TEMOVATE) 0.05 % Apply topically 2 (two) times daily.      . clopidogrel (PLAVIX) 75 MG tablet Take 1 tablet (75 mg total) by mouth daily with breakfast.      . hydrochlorothiazide (,MICROZIDE/HYDRODIURIL,) 12.5 MG capsule Take 12.5 mg by mouth daily.        . Insulin Detemir (LEVEMIR FLEXPEN) 100 UNIT/ML SOPN Inject 35 Units into the skin at bedtime.      . insulin lispro (HUMALOG KWIKPEN) 100 UNIT/ML SOPN Inject 20 Units into the skin 3 (three) times daily with meals. Uses sliding scale: 90-150=20, 151-200=21, 201-250=22, 251-300=23, 301-350=24, 351-400=25, 400 or more=26.      Marland Kitchen loperamide (IMODIUM) 2 MG capsule Take 2 mg by mouth 4 (four) times daily as needed for diarrhea or loose stools.       Marland Kitchen loratadine (CLARITIN) 10 MG tablet Take 10 mg by mouth daily.      Marland Kitchen losartan (COZAAR) 100 MG tablet Take 100 mg by mouth daily.      . pantoprazole (PROTONIX) 40 MG tablet Take 40 mg by mouth 2 (two) times daily.       . phenytoin (DILANTIN) 100  MG ER capsule Take 400 mg by mouth daily.       . pravastatin (PRAVACHOL) 20 MG tablet Take 20 mg by mouth daily.      . psyllium (METAMUCIL SMOOTH TEXTURE) 28 % packet Take 1 packet by mouth at bedtime.       No current facility-administered medications for this visit.    Discontinued Meds:    Medications Discontinued During This Encounter  Medication Reason  . atorvastatin (LIPITOR) 20 MG tablet Error    Patient Active Problem List   Diagnosis Date Noted  . CVA (cerebral infarction) 01/18/2013  . Seizure disorder 01/18/2013  . Stroke 01/18/2013  . Acute ischemic stroke 01/18/2013  . Hypercholesterolemia 11/10/2012  . Diabetes mellitus without complication   . Hypertension   . Type II or unspecified type diabetes mellitus without mention of complication, not stated as uncontrolled 10/19/2012  . Shortness of breath 10/05/2012  . Chest pain 10/05/2012  . IBS (irritable bowel syndrome) 10/15/2011  . Diarrhea 10/15/2011    LABS    Component Value Date/Time   NA 141 01/19/2013 0616   NA 140 01/18/2013  0933   NA 137 10/19/2012 1124   NA 138 11/30/2007   K 3.9 01/19/2013 0616   K 3.7 01/18/2013 0933   K 5.0 10/19/2012 1124   CL 105 01/19/2013 0616   CL 103 01/18/2013 0933   CL 101 10/19/2012 1124   CO2 25 01/19/2013 0616   CO2 24 01/18/2013 0933   CO2 22 10/19/2012 1124   GLUCOSE 142* 01/19/2013 0616   GLUCOSE 188* 01/18/2013 0933   GLUCOSE 195* 10/19/2012 1124   BUN 18 01/19/2013 0616   BUN 18 01/18/2013 0933   BUN 20 10/19/2012 1124   CREATININE 0.84 01/19/2013 0616   CREATININE 0.79 01/18/2013 0933   CREATININE 0.77 10/19/2012 1124   CREATININE 0.7 11/30/2007   CALCIUM 9.2 01/19/2013 0616   CALCIUM 10.0 01/18/2013 0933   CALCIUM 10.1 10/19/2012 1124   GFRNONAA 67* 01/19/2013 0616   GFRNONAA 81* 01/18/2013 0933   GFRNONAA 81* 10/19/2012 1124   GFRAA 78* 01/19/2013 0616   GFRAA >90 01/18/2013 0933   GFRAA >90 10/19/2012 1124   CMP     Component Value Date/Time   NA 141 01/19/2013 0616   NA  138 11/30/2007   K 3.9 01/19/2013 0616   CL 105 01/19/2013 0616   CO2 25 01/19/2013 0616   GLUCOSE 142* 01/19/2013 0616   BUN 18 01/19/2013 0616   CREATININE 0.84 01/19/2013 0616   CREATININE 0.7 11/30/2007   CALCIUM 9.2 01/19/2013 0616   PROT 7.7 01/19/2013 0616   ALBUMIN 3.0* 01/19/2013 0616   AST 13 01/19/2013 0616   ALT 13 01/19/2013 0616   ALKPHOS 125* 01/19/2013 0616   BILITOT 0.4 01/19/2013 0616   GFRNONAA 67* 01/19/2013 0616   GFRAA 78* 01/19/2013 0616       Component Value Date/Time   WBC 7.2 01/18/2013 0933   WBC 6.7 10/20/2012 0504   WBC 7.7 10/19/2012 1124   HGB 13.4 01/18/2013 0933   HGB 12.1 01/06/2013 1320   HGB 12.6 10/20/2012 0504   HCT 41.0 01/18/2013 0933   HCT 36.9 01/06/2013 1320   HCT 38.2 10/20/2012 0504   MCV 89.9 01/18/2013 0933   MCV 90.7 10/20/2012 0504   MCV 89.6 10/19/2012 1124    Lipid Panel     Component Value Date/Time   CHOL 176 01/19/2013 0616   TRIG 163* 01/19/2013 0616   HDL 40 01/19/2013 0616   CHOLHDL 4.4 01/19/2013 0616   VLDL 33 01/19/2013 0616   LDLCALC 103* 01/19/2013 0616    ABG No results found for this basename: phart, pco2, pco2art, po2, po2art, hco3, tco2, acidbasedef, o2sat     No results found for this basename: TSH   BNP (last 3 results)  Recent Labs  10/19/12 1124  PROBNP 160.7*   Cardiac Panel (last 3 results) No results found for this basename: CKTOTAL, CKMB, TROPONINI, RELINDX,  in the last 72 hours  Iron/TIBC/Ferritin No results found for this basename: iron, tibc, ferritin     EKG Orders placed during the hospital encounter of 01/18/13  . EKG     Prior Assessment and Plan Problem List as of 06/08/2013     Cardiovascular and Mediastinum   Hypertension   Last Assessment & Plan   11/10/2012 Office Visit Written 11/10/2012  1:33 PM by Lendon Colonel, NP     BP is slightly elevated today but usually better at home.  I have broached the idea of a sleep study as she is very tired during the day, hypertensive and DOE.  She wishes  to think about it and discuss with PCP. Continue current medications.    Stroke   Acute ischemic stroke     Digestive   IBS (irritable bowel syndrome)     Endocrine   Type II or unspecified type diabetes mellitus without mention of complication, not stated as uncontrolled   Diabetes mellitus without complication     Nervous and Auditory   CVA (cerebral infarction)   Seizure disorder     Other   Diarrhea   Shortness of breath   Last Assessment & Plan   10/05/2012 Office Visit Edited 10/05/2012 11:37 AM by Juanito Doom, MD     I am primarily worried about 2 major issues here in this diabetic female with shortness of breath. One concern about pulmonary embolism as the shortness of breath started abruptly after a period of immobility with leg pain.I am also concerned about possible coronary disease given the left-sided chest pain which has been associated with the shortness of breath in the last several weeks. She has also had a significant family history of coronary artery disease. At this point, she needs an extensive workup for her shortness of breath and we will start with a CT angiogram for chest look for pulmonary embolism today.I am encouraged by the fact that her office oximetry was normal with walking today.  She has some crackles on lung auscultation today but her exam is otherwise not consistent with volume overload.  Plan: -CBC, bmet now -full PFT's -CT angiogram chest to look for pulmonary embolism today -Stress echo -Followup 2 weeks    Chest pain   Hypercholesterolemia   Last Assessment & Plan   11/10/2012 Office Visit Edited 11/10/2012  1:37 PM by Lendon Colonel, NP     She is intolerant to crestor and lipitor due to myalgias. I have prescribed pravachol 20mg  daily. If she has symptoms she is to stop the medication and we will add this to medication intolerance list. She is due to see Dr. Dorris Fetch in a couple of months for labs. Will defer to him for follow up labs.         Imaging: No results found.

## 2013-06-08 NOTE — Assessment & Plan Note (Signed)
This is chronic for her. She does have a history of mild emphysema. Lungs are clear. She is moderately deconditioned. I will see her in one month for further evaluation.

## 2013-06-08 NOTE — Progress Notes (Signed)
HPI: Dorothy Carey is a 73 year old patient of Dr. Harl Bowie or following with known history of hypertension, diabetes, seizure disorder. She had a recent stress nuclear medicine study in September of 2014 which was negative for ischemia revealing an EF of 49%.     She was last seen in the office in September 2014 without any new symptoms but continued to have easy fatigability and dyspnea. On the last visit, which suggested a sleep study to evaluate for obstructive sleep apnea in the setting of her chronic shortness of breath fatigue and hypertension. She was advised to stick with her PCP about that. Also on that visit she was found to be intolerant to Crestor and Lipitor, and described Pravachol 20 mg daily.    Since being seen last the patient had hospitalization in December of 2014 with Very small acute nonhemorrhagic infarcts involving portions of the left subinsular region and left periatrial region. She was started on Plavix 75 mg daily and underwent physical rehabilitation at [facility]. She has very little residual weakness.   She brings with her her most recent labs that have been completed on 04/29/2013: Total cholesterol 217 triglycerides 227 HDL 45 LDL of 127. Glucose 121 BUN 20 creatinine 0.82 sodium 144 potassium 4.6 chloride 103 CO2 26 alkaline phosphatase slightly elevated at 127.   She comes today without complaint but is mildly hypertensive.   No Known Allergies  Current Outpatient Prescriptions  Medication Sig Dispense Refill  . acetaminophen (TYLENOL) 500 MG tablet Take 500 mg by mouth as needed for pain.      . clobetasol cream (TEMOVATE) 0.05 % Apply topically 2 (two) times daily.      . clopidogrel (PLAVIX) 75 MG tablet Take 1 tablet (75 mg total) by mouth daily with breakfast.      . hydrochlorothiazide (,MICROZIDE/HYDRODIURIL,) 12.5 MG capsule Take 12.5 mg by mouth daily.        . Insulin Detemir (LEVEMIR FLEXPEN) 100 UNIT/ML SOPN Inject 35 Units into the skin at  bedtime.      . insulin lispro (HUMALOG KWIKPEN) 100 UNIT/ML SOPN Inject 20 Units into the skin 3 (three) times daily with meals. Uses sliding scale: 90-150=20, 151-200=21, 201-250=22, 251-300=23, 301-350=24, 351-400=25, 400 or more=26.      Marland Kitchen loperamide (IMODIUM) 2 MG capsule Take 2 mg by mouth 4 (four) times daily as needed for diarrhea or loose stools.       Marland Kitchen loratadine (CLARITIN) 10 MG tablet Take 10 mg by mouth daily.      Marland Kitchen losartan (COZAAR) 100 MG tablet Take 100 mg by mouth daily.      . pantoprazole (PROTONIX) 40 MG tablet Take 40 mg by mouth 2 (two) times daily.       . phenytoin (DILANTIN) 100 MG ER capsule Take 400 mg by mouth daily.       . pravastatin (PRAVACHOL) 20 MG tablet Take 20 mg by mouth daily.      . psyllium (METAMUCIL SMOOTH TEXTURE) 28 % packet Take 1 packet by mouth at bedtime.       No current facility-administered medications for this visit.    Past Medical History  Diagnosis Date  . Ruptured disk   . Internal hemorrhoid   . Hematochezia   . Seizures   . Chronic diarrhea   . IBS (irritable bowel syndrome) 10/15/2011  . Diabetes mellitus without complication   . Hypertension     Past Surgical History  Procedure Laterality Date  . Colonoscopy  06    ROURK  . Colonoscopy  04    FLEISHMAN  . Breast reduction surgery    . Foot surgery      Bone spurs  . Abdominal hysterectomy    . Appendectomy    . Bladder tac    . Colonoscopy N/A 01/06/2013    Procedure: COLONOSCOPY;  Surgeon: Rogene Houston, MD;  Location: [facility];  Service: Endoscopy;  Laterality: N/A;  100  . Cholecystectomy      ROS: Review of systems complete and found to be negative unless listed above  PHYSICAL EXAM BP 160/51  Pulse 78  Ht 5\' 3"  (1.6 m)  Wt 158 lb (71.668 kg)  BMI 28.00 kg/m2  SpO2 100% General: Well developed, well nourished, in no acute distress Head: Eyes PERRLA, No xanthomas.   Normal cephalic and atramatic  Lungs: Clear bilaterally to auscultation and  percussion. Heart: HRRR S1 S2, without MRG.  Pulses are 2+ & equal.            No carotid bruit. No JVD.  No abdominal bruits. No femoral bruits. Abdomen: Bowel sounds are positive, abdomen soft and non-tender without masses or                  Hernia's noted. Msk:  Back normal, normal gait. Normal strength and tone for age. Extremities: No clubbing, cyanosis or edema.  DP +1 Neuro: Alert and oriented X 3. Psych:  Good affect, responds appropriately    ASSESSMENT AND PLAN

## 2013-06-08 NOTE — Patient Instructions (Signed)
Your physician recommends that you schedule a follow-up appointment in: 1 month with Dr Harl Bowie   Your physician has recommended you make the following change in your medication:   Start Amlodipine 5 mg daily at bedtime  Increased Pravastatin to 40 mg daily

## 2013-06-08 NOTE — Assessment & Plan Note (Signed)
Cholesterol status is not optimal. I will increase her Pravachol to 40 mg by mouth at bedtime. Will followup with labs in 3 months he did see her primary care Dr. Quintin Alto or office.

## 2013-06-08 NOTE — Assessment & Plan Note (Addendum)
Blood pressure is currently not well controlled at 160/51. Manual recheck 168/68. The patient states her blood pressures running high at home in the 494W systolic on her home blood pressure machine.  I will begin amlodipine 5 mg one by mouth at at bedtime, continue Cozaar, and HCTZ 12.5 mg. I have given to her blood pressure recording sheets and asked to take her blood pressure at the same time everyday and record. I given her parameters of blood pressures between 9:67 and 591 systolic, 60 and 75 diastolic.  She is to call us and she has significant decrease in her blood pressure was unable to tolerate the medications. I have also advised that she may notice some mild ankle puffiness as result of the medication.  I will see her again in one month unless symptomatic.

## 2013-07-08 ENCOUNTER — Encounter: Payer: Self-pay | Admitting: Cardiology

## 2013-07-08 ENCOUNTER — Ambulatory Visit (INDEPENDENT_AMBULATORY_CARE_PROVIDER_SITE_OTHER): Payer: Medicare Other | Admitting: Cardiology

## 2013-07-08 ENCOUNTER — Telehealth: Payer: Self-pay | Admitting: Cardiology

## 2013-07-08 VITALS — BP 160/63 | HR 83 | Ht 63.0 in | Wt 156.0 lb

## 2013-07-08 DIAGNOSIS — R0609 Other forms of dyspnea: Secondary | ICD-10-CM

## 2013-07-08 DIAGNOSIS — R0600 Dyspnea, unspecified: Secondary | ICD-10-CM

## 2013-07-08 DIAGNOSIS — R0989 Other specified symptoms and signs involving the circulatory and respiratory systems: Secondary | ICD-10-CM

## 2013-07-08 DIAGNOSIS — I10 Essential (primary) hypertension: Secondary | ICD-10-CM

## 2013-07-08 DIAGNOSIS — E785 Hyperlipidemia, unspecified: Secondary | ICD-10-CM

## 2013-07-08 MED ORDER — AMLODIPINE BESYLATE 10 MG PO TABS: 10.0000 mg | ORAL_TABLET | Freq: Every day | ORAL | Status: AC

## 2013-07-08 MED ORDER — CLOPIDOGREL BISULFATE 75 MG PO TABS: 75.0000 mg | ORAL_TABLET | Freq: Every day | ORAL | Status: AC

## 2013-07-08 MED ORDER — PRAVASTATIN SODIUM 40 MG PO TABS: 40.0000 mg | ORAL_TABLET | Freq: Every day | ORAL | Status: AC

## 2013-07-08 NOTE — Patient Instructions (Signed)
Your physician recommends that you schedule a follow-up appointment in: 6 months with Dr Bryna Colander will receive a reminder letter two months in advance reminding you to call and schedule your appointment. If you don't receive this letter, please contact our office.  Please com back for a nurse blood pressure check in 2 weeks. Please bring in all your medications and the blood pressure log.  Your physician recommends that you return for lab work this week. Fasting lipid panel

## 2013-07-08 NOTE — Progress Notes (Signed)
Clinical Summary [name] is a 73 y.o.female last seen by NP Dorothy Carey, this is our first visit together. She is seen for the following medical problems.  1. Dyspnea - chronic per notes, prior MPI without evidence of ischemia. Echo with normal LVEF 50-55% and grade I diastolic dysfunction.  - symptoms remains stable  2. HTN - last visit started on norvasc 5mg  daily - home log 130s-160s/70-80s  3. Hyperlipidemia - side effects to crestor and lipitor caused muscle aches - compliant with pravastatin. Last clinic note mentions prava was increased to 40mg  daily however patient states she has been on this dose for a very long time Last panel 01/2013: TC 176 TG 163 HDL 40 LDL 103  4. CVA - on plavix and statin for secondary prevention  Past Medical History  Diagnosis Date  . Ruptured disk   . Internal hemorrhoid   . Hematochezia   . Seizures   . Chronic diarrhea   . IBS (irritable bowel syndrome) 10/15/2011  . Diabetes mellitus without complication   . Hypertension      No Known Allergies   Current Outpatient Prescriptions  Medication Sig Dispense Refill  . acetaminophen (TYLENOL) 500 MG tablet Take 500 mg by mouth as needed for pain.      Marland Kitchen amLODipine (NORVASC) 5 MG tablet Take 1 tablet (5 mg total) by mouth at bedtime.  90 tablet  3  . clobetasol cream (TEMOVATE) 0.05 % Apply topically 2 (two) times daily.      . clopidogrel (PLAVIX) 75 MG tablet Take 1 tablet (75 mg total) by mouth daily with breakfast.      . hydrochlorothiazide (,MICROZIDE/HYDRODIURIL,) 12.5 MG capsule Take 12.5 mg by mouth daily.        . Insulin Detemir (LEVEMIR FLEXPEN) 100 UNIT/ML SOPN Inject 35 Units into the skin at bedtime.      . insulin lispro (HUMALOG KWIKPEN) 100 UNIT/ML SOPN Inject 20 Units into the skin 3 (three) times daily with meals. Uses sliding scale: 90-150=20, 151-200=21, 201-250=22, 251-300=23, 301-350=24, 351-400=25, 400 or more=26.      Marland Kitchen loperamide (IMODIUM) 2 MG capsule Take 2  mg by mouth 4 (four) times daily as needed for diarrhea or loose stools.       Marland Kitchen loratadine (CLARITIN) 10 MG tablet Take 10 mg by mouth daily.      Marland Kitchen losartan (COZAAR) 100 MG tablet Take 100 mg by mouth daily.      . pantoprazole (PROTONIX) 40 MG tablet Take 40 mg by mouth 2 (two) times daily.       . phenytoin (DILANTIN) 100 MG ER capsule Take 400 mg by mouth daily.       . pravastatin (PRAVACHOL) 40 MG tablet Take 1 tablet (40 mg total) by mouth daily.  90 tablet  3  . psyllium (METAMUCIL SMOOTH TEXTURE) 28 % packet Take 1 packet by mouth at bedtime.       No current facility-administered medications for this visit.     Past Surgical History  Procedure Laterality Date  . Colonoscopy  06    ROURK  . Colonoscopy  04    FLEISHMAN  . Breast reduction surgery    . Foot surgery      Bone spurs  . Abdominal hysterectomy    . Appendectomy    . Bladder tac    . Colonoscopy N/A 01/06/2013    Procedure: COLONOSCOPY;  Surgeon: Rogene Houston, MD;  Location: [facility];  Service: Endoscopy;  Laterality: N/A;  100  . Cholecystectomy       No Known Allergies    Family History  Problem Relation Age of Onset  . COPD Mother   . Liver cancer Father      Social History [name] reports that she has never smoked. She has never used smokeless tobacco. [name] reports that she does not drink alcohol.   Review of Systems CONSTITUTIONAL: No weight loss, fever, chills, weakness or fatigue.  HEENT: Eyes: No visual loss, blurred vision, double vision or yellow sclerae.No hearing loss, sneezing, congestion, runny nose or sore throat.  SKIN: No rash or itching.  CARDIOVASCULAR: per HPI RESPIRATORY: chronic SOB  GASTROINTESTINAL: No anorexia, nausea, vomiting or diarrhea. No abdominal pain or blood.  GENITOURINARY: No burning on urination, no polyuria NEUROLOGICAL: No headache, dizziness, syncope, paralysis, ataxia, numbness or tingling in the extremities. No change in bowel or bladder  control.  MUSCULOSKELETAL: No muscle, back pain, joint pain or stiffness.  LYMPHATICS: No enlarged nodes. No history of splenectomy.  PSYCHIATRIC: No history of depression or anxiety.  ENDOCRINOLOGIC: No reports of sweating, cold or heat intolerance. No polyuria or polydipsia.  Marland Kitchen   Physical Examination p 83 bp 160/63 Wt 156 lbs BMI 28 Gen: resting comfortably, no acute distress HEENT: no scleral icterus, pupils equal round and reactive, no palptable cervical adenopathy,  CV: RRR, no m/r/g, no JVD, no carotid bruits Resp: Clear to auscultation bilaterally GI: abdomen is soft, non-tender, non-distended, normal bowel sounds, no hepatosplenomegaly MSK: extremities are warm, no edema.  Skin: warm, no rash Neuro:  no focal deficits Psych: appropriate affect   Diagnostic Studies 10/2012 Stress MPI Scintigraphic Data: Tomographic views were obtained using the short axis, vertical long axis, and horizontal long axis planes.  The stress images showed essentially normal homogeneous uptake of the mild cardial radioactive tracer in all wall segments, with the exception of a very small and mild fixed defect seen in the mid inferolateral and mid inferoapical walls. Resting images were notably worse. There are increased GI counts which appear to be stealing accounts from the myocardium.  While the calculated ejection fraction was 49%, gross left ventricular systolic function appears be normal with an approximated left ventricular ejection fraction of 60%, with no wall motion abnormalities noted.  IMPRESSION: 1. Probably normal left ventricular myocardial perfusion. 2. There is a small mild fixed defect seen in the mid inferolateral and mid inferoapical walls, which appears to be due to artifact from increased GI counts which appeared to be stealing counts from the myocardium.  3. Calculated LVEF is 88%, but LV systolic function appears to be grossly normal, with an estimated LVEF fo  60%. No wall motion abnormalities.   01/2013 Echo  Study Conclusions  - Left ventricle: The cavity size was normal. Wall thickness was increased in a pattern of mild LVH. Systolic function was normal. The estimated ejection fraction was in the range of 50% to 55%. Diffuse hypokinesis. Although no diagnostic regional wall motion abnormality was identified, this possibility cannot be completely excluded on the basis of this study. Doppler parameters are consistent with abnormal left ventricular relaxation (grade 1 diastolic dysfunction). - Ventricular septum: Septal motion showed dyssynergy. - Aortic valve: Mildly calcified annulus. Trileaflet. No significant regurgitation. Mean gradient: 85mm Hg (S). - Mitral valve: Calcified annulus. Mildly calcified leaflets . Probable reverberation artifact seen intermittently on atrialside of mitral valve near annulus. Mild regurgitation. - Left atrium: The atrium was mildly dilated. - Right atrium: Central venous  pressure: 75mm Hg (est). - Atrial septum: No defect or patent foramen ovale was identified. - Tricuspid valve: Trivial regurgitation. - Pulmonary arteries: Systolic pressure could not be accurately estimated. - Pericardium, extracardiac: There was no pericardial effusion. Impressions:  - Comparison to recent study from September 2014. MIld LVH with LVEF approximately 73-71%, grade 1 diastolic dysfunction with septal dyssynergy. Mild left atrial enlargement. MAC with mild mitral regurgitation. Unable to assess PASP, CVP appears normal range. No obvious PFO or ASD.     Assessment and Plan  1. Chronic dyspnea - no clear cardiac cause, she has had prior testing including echo and stress test without significant pathology - symptoms remain stable  2. HTN - remains above goal, her home meds remain somewhat unclear. We call her pharmacy who states she has not picked up HCTZ or losartan is a very long time, she states she gets  some meds in the mail but isn't sure which ones - she will keep bp log x 2 weeks, then return for nursing visit for bp check with all of her pill bottles to document exactly what she has at home  3. Hyperlipidemia - repeat lipid panel  4. CVA - continue plavix, statin, ACE-I for secondary prevention      Arnoldo Lenis, M.D., F.A.C.C.

## 2013-07-08 NOTE — Telephone Encounter (Signed)
refilled 

## 2013-07-08 NOTE — Telephone Encounter (Signed)
Needs refill on Pravastatin sent to pharmacy / tgs

## 2013-07-14 ENCOUNTER — Emergency Department (HOSPITAL_COMMUNITY): Payer: Medicare Other

## 2013-07-14 ENCOUNTER — Emergency Department (HOSPITAL_COMMUNITY)
Admission: EM | Admit: 2013-07-14 | Discharge: 2013-07-14 | Disposition: A | Discharge status: Home / Self Care | Payer: Medicare Other | Attending: Emergency Medicine | Admitting: Emergency Medicine

## 2013-07-14 ENCOUNTER — Encounter (HOSPITAL_COMMUNITY): Payer: Self-pay | Admitting: Emergency Medicine

## 2013-07-14 DIAGNOSIS — N390 Urinary tract infection, site not specified: Secondary | ICD-10-CM | POA: Insufficient documentation

## 2013-07-14 DIAGNOSIS — Y9389 Activity, other specified: Secondary | ICD-10-CM | POA: Insufficient documentation

## 2013-07-14 DIAGNOSIS — R197 Diarrhea, unspecified: Secondary | ICD-10-CM | POA: Insufficient documentation

## 2013-07-14 DIAGNOSIS — F29 Unspecified psychosis not due to a substance or known physiological condition: Secondary | ICD-10-CM | POA: Diagnosis not present

## 2013-07-14 DIAGNOSIS — Z043 Encounter for examination and observation following other accident: Secondary | ICD-10-CM | POA: Insufficient documentation

## 2013-07-14 DIAGNOSIS — R05 Cough: Secondary | ICD-10-CM | POA: Insufficient documentation

## 2013-07-14 DIAGNOSIS — R262 Difficulty in walking, not elsewhere classified: Secondary | ICD-10-CM | POA: Insufficient documentation

## 2013-07-14 DIAGNOSIS — E119 Type 2 diabetes mellitus without complications: Secondary | ICD-10-CM | POA: Diagnosis not present

## 2013-07-14 DIAGNOSIS — Z8739 Personal history of other diseases of the musculoskeletal system and connective tissue: Secondary | ICD-10-CM | POA: Diagnosis not present

## 2013-07-14 DIAGNOSIS — Z8673 Personal history of transient ischemic attack (TIA), and cerebral infarction without residual deficits: Secondary | ICD-10-CM | POA: Diagnosis not present

## 2013-07-14 DIAGNOSIS — I10 Essential (primary) hypertension: Secondary | ICD-10-CM | POA: Diagnosis not present

## 2013-07-14 DIAGNOSIS — Z794 Long term (current) use of insulin: Secondary | ICD-10-CM | POA: Insufficient documentation

## 2013-07-14 DIAGNOSIS — Y9289 Other specified places as the place of occurrence of the external cause: Secondary | ICD-10-CM | POA: Insufficient documentation

## 2013-07-14 DIAGNOSIS — Z7902 Long term (current) use of antithrombotics/antiplatelets: Secondary | ICD-10-CM | POA: Insufficient documentation

## 2013-07-14 DIAGNOSIS — R059 Cough, unspecified: Secondary | ICD-10-CM | POA: Diagnosis not present

## 2013-07-14 DIAGNOSIS — Z79899 Other long term (current) drug therapy: Secondary | ICD-10-CM | POA: Insufficient documentation

## 2013-07-14 DIAGNOSIS — K589 Irritable bowel syndrome without diarrhea: Secondary | ICD-10-CM | POA: Diagnosis not present

## 2013-07-14 DIAGNOSIS — R296 Repeated falls: Secondary | ICD-10-CM | POA: Diagnosis not present

## 2013-07-14 DIAGNOSIS — G40909 Epilepsy, unspecified, not intractable, without status epilepticus: Secondary | ICD-10-CM | POA: Diagnosis not present

## 2013-07-14 HISTORY — DX: Cerebral infarction, unspecified: I63.9

## 2013-07-14 LAB — URINALYSIS, ROUTINE W REFLEX MICROSCOPIC
BILIRUBIN URINE: NEGATIVE
GLUCOSE, UA: NEGATIVE mg/dL
Ketones, ur: NEGATIVE mg/dL
Nitrite: NEGATIVE
PH: 6 (ref 5.0–8.0)
Protein, ur: >300 mg/dL — AB
Specific Gravity, Urine: >1.03 — ABNORMAL HIGH (ref 1.005–1.030)
Urobilinogen, UA: 0.2 mg/dL (ref 0.0–1.0)

## 2013-07-14 LAB — BASIC METABOLIC PANEL
BUN: 21 mg/dL (ref 6–23)
CALCIUM: 9.1 mg/dL (ref 8.4–10.5)
CO2: 26 meq/L (ref 19–32)
CREATININE: 0.82 mg/dL (ref 0.50–1.10)
Chloride: 102 mEq/L (ref 96–112)
GFR calc Af Amer: 80 mL/min — ABNORMAL LOW (ref >90)
GFR calc non Af Amer: 69 mL/min — ABNORMAL LOW (ref >90)
GLUCOSE: 142 mg/dL — AB (ref 70–99)
Potassium: 4 mEq/L (ref 3.7–5.3)
Sodium: 141 mEq/L (ref 137–147)

## 2013-07-14 LAB — CBC WITH DIFFERENTIAL/PLATELET
Basophils Absolute: 0 10*3/uL (ref 0.0–0.1)
Basophils Relative: 0 % (ref 0–1)
EOS PCT: 1 % (ref 0–5)
Eosinophils Absolute: 0.1 10*3/uL (ref 0.0–0.7)
HEMATOCRIT: 37 % (ref 36.0–46.0)
Hemoglobin: 11.8 g/dL — ABNORMAL LOW (ref 12.0–15.0)
LYMPHS ABS: 1 10*3/uL (ref 0.7–4.0)
Lymphocytes Relative: 11 % — ABNORMAL LOW (ref 12–46)
MCH: 28.9 pg (ref 26.0–34.0)
MCHC: 31.9 g/dL (ref 30.0–36.0)
MCV: 90.5 fL (ref 78.0–100.0)
MONO ABS: 1 10*3/uL (ref 0.1–1.0)
Monocytes Relative: 10 % (ref 3–12)
Neutro Abs: 7.5 10*3/uL (ref 1.7–7.7)
Neutrophils Relative %: 78 % — ABNORMAL HIGH (ref 43–77)
Platelets: 238 10*3/uL (ref 150–400)
RBC: 4.09 MIL/uL (ref 3.87–5.11)
RDW: 12.9 % (ref 11.5–15.5)
WBC: 9.6 10*3/uL (ref 4.0–10.5)

## 2013-07-14 LAB — URINE MICROSCOPIC-ADD ON

## 2013-07-14 LAB — PHENYTOIN LEVEL, TOTAL: Phenytoin Lvl: 15 ug/mL (ref 10.0–20.0)

## 2013-07-14 LAB — CBG MONITORING, ED: Glucose-Capillary: 165 mg/dL — ABNORMAL HIGH (ref 70–99)

## 2013-07-14 MED ORDER — SULFAMETHOXAZOLE-TMP DS 800-160 MG PO TABS: 1.0000 | ORAL_TABLET | Freq: Two times a day (BID) | ORAL | Status: DC

## 2013-07-14 NOTE — Discharge Instructions (Signed)
Urinary Tract Infection  Urinary tract infections (UTIs) can develop anywhere along your urinary tract. Your urinary tract is your body's drainage system for removing wastes and extra water. Your urinary tract includes two kidneys, two ureters, a bladder, and a urethra. Your kidneys are a pair of bean-shaped organs. Each kidney is about the size of your fist. They are located below your ribs, one on each side of your spine.  CAUSES  Infections are caused by microbes, which are microscopic organisms, including fungi, viruses, and bacteria. These organisms are so small that they can only be seen through a microscope. Bacteria are the microbes that most commonly cause UTIs.  SYMPTOMS   Symptoms of UTIs may vary by age and gender of the patient and by the location of the infection. Symptoms in young women typically include a frequent and intense urge to urinate and a painful, burning feeling in the bladder or urethra during urination. Older women and men are more likely to be tired, shaky, and weak and have muscle aches and abdominal pain. A fever may mean the infection is in your kidneys. Other symptoms of a kidney infection include pain in your back or sides below the ribs, nausea, and vomiting.  DIAGNOSIS  To diagnose a UTI, your caregiver will ask you about your symptoms. Your caregiver also will ask to provide a urine sample. The urine sample will be tested for bacteria and white blood cells. White blood cells are made by your body to help fight infection.  TREATMENT   Typically, UTIs can be treated with medication. Because most UTIs are caused by a bacterial infection, they usually can be treated with the use of antibiotics. The choice of antibiotic and length of treatment depend on your symptoms and the type of bacteria causing your infection.  HOME CARE INSTRUCTIONS   If you were prescribed antibiotics, take them exactly as your caregiver instructs you. Finish the medication even if you feel better after you  have only taken some of the medication.   Drink enough water and fluids to keep your urine clear or pale yellow.   Avoid caffeine, tea, and carbonated beverages. They tend to irritate your bladder.   Empty your bladder often. Avoid holding urine for long periods of time.   Empty your bladder before and after sexual intercourse.   After a bowel movement, women should cleanse from front to back. Use each tissue only once.  SEEK MEDICAL CARE IF:    You have back pain.   You develop a fever.   Your symptoms do not begin to resolve within 3 days.  SEEK IMMEDIATE MEDICAL CARE IF:    You have severe back pain or lower abdominal pain.   You develop chills.   You have nausea or vomiting.   You have continued burning or discomfort with urination.  MAKE SURE YOU:    Understand these instructions.   Will watch your condition.   Will get help right away if you are not doing well or get worse.  Document Released: 11/14/2004 Document Revised: 08/06/2011 Document Reviewed: 03/15/2011  [employer] Patient Information 2014 [employer].

## 2013-07-14 NOTE — ED Provider Notes (Signed)
CSN: [id number]     Arrival date & time 07/14/13  1457 History   First MD Initiated Contact with Patient 07/14/13 1554     Chief Complaint  Patient presents with  . Fall  . Difficulty Walking     (Consider location/radiation/quality/duration/timing/severity/associated sxs/prior Treatment) Patient is a 73 y.o. female presenting with fall. The history is provided by the patient and a relative.  Fall Pertinent negatives include no chest pain, no abdominal pain, no headaches and no shortness of breath.   the patient had a fall in a parking lot. She landed onto her rear end. She states it is because she is unsteady from previous stroke. Patient states she did not hurt anything. She states she's feeling a little confused. She states it started prior to the fall. Family members state that she told him she fell in the hallway. Patient has changed his story mildly while in the ED. She states she has been coughing. She states she did not sleep well last night due to. She denies dysuria. She denies chest pain. No seizure.   Past Medical History  Diagnosis Date  . Ruptured disk   . Internal hemorrhoid   . Hematochezia   . Seizures   . Chronic diarrhea   . IBS (irritable bowel syndrome) 10/15/2011  . Diabetes mellitus without complication   . Hypertension   . Stroke    Past Surgical History  Procedure Laterality Date  . Colonoscopy  06    ROURK  . Colonoscopy  04    FLEISHMAN  . Breast reduction surgery    . Foot surgery      Bone spurs  . Abdominal hysterectomy    . Appendectomy    . Bladder tac    . Colonoscopy N/A 01/06/2013    Procedure: COLONOSCOPY;  Surgeon: Rogene Houston, MD;  Location: [facility];  Service: Endoscopy;  Laterality: N/A;  100  . Cholecystectomy     Family History  Problem Relation Age of Onset  . COPD Mother   . Liver cancer Father    History  Substance Use Topics  . Smoking status: Never Smoker   . Smokeless tobacco: Never Used  . Alcohol Use: No    OB History   Grav Para Term Preterm Abortions TAB SAB Ect Mult Living                 Review of Systems  Constitutional: Negative for activity change and appetite change.  Eyes: Negative for pain.  Respiratory: Positive for cough. Negative for chest tightness and shortness of breath.   Cardiovascular: Negative for chest pain and leg swelling.  Gastrointestinal: Negative for nausea, vomiting, abdominal pain and diarrhea.  Genitourinary: Negative for flank pain.  Musculoskeletal: Negative for back pain and neck stiffness.  Skin: Negative for rash.  Neurological: Negative for weakness, numbness and headaches.  Psychiatric/Behavioral: Positive for confusion. Negative for behavioral problems.      Allergies  Review of patient's allergies indicates no known allergies.  Home Medications   Prior to Admission medications   Medication Sig Start Date End Date Taking? Authorizing Provider  acetaminophen (TYLENOL) 500 MG tablet Take 500 mg by mouth as needed for pain.    Historical Provider, MD  amLODipine (NORVASC) 10 MG tablet Take 1 tablet (10 mg total) by mouth at bedtime. 07/08/13   Arnoldo Lenis, MD  clobetasol cream (TEMOVATE) 0.05 % Apply topically 2 (two) times daily.    Historical Provider, MD  clopidogrel (PLAVIX) 75  MG tablet Take 1 tablet (75 mg total) by mouth daily with breakfast. 07/08/13   Arnoldo Lenis, MD  hydrochlorothiazide (,MICROZIDE/HYDRODIURIL,) 12.5 MG capsule Take 12.5 mg by mouth daily.      Historical Provider, MD  Insulin Detemir (LEVEMIR FLEXPEN) 100 UNIT/ML SOPN Inject 35 Units into the skin at bedtime. 01/20/13   Shanker Kristeen Mans, MD  insulin lispro (HUMALOG KWIKPEN) 100 UNIT/ML SOPN Inject 20 Units into the skin 3 (three) times daily with meals. Uses sliding scale: 90-150=20, 151-200=21, 201-250=22, 251-300=23, 301-350=24, 351-400=25, 400 or more=26.    Historical Provider, MD  loperamide (IMODIUM) 2 MG capsule Take 2 mg by mouth 4 (four) times daily  as needed for diarrhea or loose stools.     Historical Provider, MD  loratadine (CLARITIN) 10 MG tablet Take 10 mg by mouth daily.    Historical Provider, MD  losartan (COZAAR) 100 MG tablet Take 100 mg by mouth daily.    Historical Provider, MD  pantoprazole (PROTONIX) 40 MG tablet Take 40 mg by mouth 2 (two) times daily.     Historical Provider, MD  phenytoin (DILANTIN) 100 MG ER capsule Take 400 mg by mouth daily.     Historical Provider, MD  pravastatin (PRAVACHOL) 40 MG tablet Take 1 tablet (40 mg total) by mouth daily. 07/08/13   Arnoldo Lenis, MD  psyllium (METAMUCIL SMOOTH TEXTURE) 28 % packet Take 1 packet by mouth at bedtime. 12/14/12   Rogene Houston, MD   BP 164/77  Pulse 89  Temp(Src) 98.3 F (36.8 C) (Oral)  Resp 15  SpO2 96% Physical Exam  Nursing note and vitals reviewed. Constitutional: She is oriented to person, place, and time. She appears well-developed and well-nourished.  HENT:  Head: Normocephalic and atraumatic.  Eyes: EOM are normal. Pupils are equal, round, and reactive to light.  Neck: Normal range of motion. Neck supple.  Cardiovascular: Normal rate, regular rhythm and normal heart sounds.   No murmur heard. Pulmonary/Chest: Effort normal. No respiratory distress. She has no wheezes. She has no rales.  Mildly harsh breath sounds.   Abdominal: Soft. Bowel sounds are normal. She exhibits no distension. There is no tenderness. There is no rebound and no guarding.  Musculoskeletal: Normal range of motion.  Neurological: She is alert and oriented to person, place, and time. No cranial nerve deficit.  Skin: Skin is warm and dry.  Psychiatric: She has a normal mood and affect. Her speech is normal.    ED Course  Procedures (including critical care time) Labs Review Labs Reviewed  CBC WITH DIFFERENTIAL - Abnormal; Notable for the following:    Hemoglobin 11.8 (*)    Neutrophils Relative % 78 (*)    Lymphocytes Relative 11 (*)    All other components  within normal limits  BASIC METABOLIC PANEL - Abnormal; Notable for the following:    Glucose, Bld 142 (*)    GFR calc non Af Amer 69 (*)    GFR calc Af Amer 80 (*)    All other components within normal limits  URINALYSIS, ROUTINE W REFLEX MICROSCOPIC - Abnormal; Notable for the following:    APPearance HAZY (*)    Specific Gravity, Urine >1.030 (*)    Hgb urine dipstick TRACE (*)    Protein, ur >300 (*)    Leukocytes, UA SMALL (*)    All other components within normal limits  URINE MICROSCOPIC-ADD ON - Abnormal; Notable for the following:    Squamous Epithelial / LPF FEW (*)  Bacteria, UA MANY (*)    All other components within normal limits  CBG MONITORING, ED - Abnormal; Notable for the following:    Glucose-Capillary 165 (*)    All other components within normal limits  URINE CULTURE  PHENYTOIN LEVEL, TOTAL    Imaging Review Dg Chest 2 View  07/14/2013   CLINICAL DATA:  Difficulty walking today with resulting fall. History of stroke.  EXAM: CHEST  2 VIEW  COMPARISON:  01/18/2013 radiographs.  FINDINGS: The heart size and mediastinal contours are stable with mild cardiac enlargement. There is improved aeration of the lungs which are now clear and mildly hyperinflated. There is no pleural effusion or pneumothorax. No fractures are identified. Degenerative changes are present throughout the thoracic spine. Telemetry leads overlie the chest.  IMPRESSION: Chronic obstructive pulmonary disease and mild cardiomegaly. No acute cardiopulmonary process.   Electronically Signed   By: Camie Patience M.D.   On: 07/14/2013 16:35   Ct Head Wo Contrast  07/14/2013   CLINICAL DATA:  Altered mental status and difficulty walking after a fall.  EXAM: CT HEAD WITHOUT CONTRAST  TECHNIQUE: Contiguous axial images were obtained from the base of the skull through the vertex without intravenous contrast.  COMPARISON:  01/18/2013  FINDINGS: There is no acute intracranial hemorrhage, acute infarction, or mass  lesion.  The patient has multiple old infarcts including in the right basal ganglia, left frontal lobe, and both frontoparietal regions. There small old white matter infarcts in both corona radiata.  There is secondary encephalomalacia at the site of the infarcts. The infarcts in the left corona radiata and left frontoparietal region are new since the prior study but are not acute. There is mild diffuse atrophy with secondary slight prominence of the ventricles, stable. No osseous abnormality.  IMPRESSION: No acute intracranial abnormality. Multiple old infarcts, progressed since 01/18/2013.   Electronically Signed   By: Rozetta Nunnery M.D.   On: 07/14/2013 16:49     EKG Interpretation None      MDM   Final diagnoses:  UTI (urinary tract infection)    Patient with fall. No apparent injury from the fall. Has urinary tract infection. Has mild confusion, which may be due to the infection. Discussed with patient and her family, all of whom feel comfortable with her discharge home with antibiotics    Jasper Riling. Alvino Chapel, MD 07/15/13 7416

## 2013-07-14 NOTE — ED Notes (Signed)
Pt reports has had a stroke in the past and caused her to have difficulty walking.  Reports had gone through rehab and was able to walk without a cane or walker.  Was in the parking lot of [employer] around 1130 this morning and was having difficulty walking and fell.  Pt went back home and called her daughter because she was "breathing hard" and sounded confused on the phone.  Pt still feels off balance.

## 2013-07-15 ENCOUNTER — Inpatient Hospital Stay (HOSPITAL_COMMUNITY)
Admission: EM | Admit: 2013-07-15 | Discharge: 2013-07-19 | DRG: 689 | Disposition: A | Discharge status: Skilled Nursing Facility | Payer: Medicare Other | Attending: Internal Medicine | Admitting: Internal Medicine

## 2013-07-15 ENCOUNTER — Encounter (HOSPITAL_COMMUNITY): Payer: Self-pay | Admitting: Emergency Medicine

## 2013-07-15 DIAGNOSIS — I69959 Hemiplegia and hemiparesis following unspecified cerebrovascular disease affecting unspecified side: Secondary | ICD-10-CM

## 2013-07-15 DIAGNOSIS — R531 Weakness: Secondary | ICD-10-CM

## 2013-07-15 DIAGNOSIS — Z794 Long term (current) use of insulin: Secondary | ICD-10-CM

## 2013-07-15 DIAGNOSIS — R197 Diarrhea, unspecified: Secondary | ICD-10-CM

## 2013-07-15 DIAGNOSIS — R4182 Altered mental status, unspecified: Secondary | ICD-10-CM

## 2013-07-15 DIAGNOSIS — E7800 Pure hypercholesterolemia, unspecified: Secondary | ICD-10-CM

## 2013-07-15 DIAGNOSIS — E119 Type 2 diabetes mellitus without complications: Secondary | ICD-10-CM

## 2013-07-15 DIAGNOSIS — R112 Nausea with vomiting, unspecified: Secondary | ICD-10-CM

## 2013-07-15 DIAGNOSIS — K589 Irritable bowel syndrome without diarrhea: Secondary | ICD-10-CM

## 2013-07-15 DIAGNOSIS — T370X5A Adverse effect of sulfonamides, initial encounter: Secondary | ICD-10-CM | POA: Diagnosis present

## 2013-07-15 DIAGNOSIS — I10 Essential (primary) hypertension: Secondary | ICD-10-CM

## 2013-07-15 DIAGNOSIS — R0602 Shortness of breath: Secondary | ICD-10-CM

## 2013-07-15 DIAGNOSIS — E869 Volume depletion, unspecified: Secondary | ICD-10-CM | POA: Diagnosis present

## 2013-07-15 DIAGNOSIS — G40909 Epilepsy, unspecified, not intractable, without status epilepticus: Secondary | ICD-10-CM

## 2013-07-15 DIAGNOSIS — G9340 Encephalopathy, unspecified: Secondary | ICD-10-CM | POA: Diagnosis present

## 2013-07-15 DIAGNOSIS — Z800 Family history of malignant neoplasm of digestive organs: Secondary | ICD-10-CM

## 2013-07-15 DIAGNOSIS — I639 Cerebral infarction, unspecified: Secondary | ICD-10-CM

## 2013-07-15 DIAGNOSIS — R079 Chest pain, unspecified: Secondary | ICD-10-CM

## 2013-07-15 DIAGNOSIS — N390 Urinary tract infection, site not specified: Principal | ICD-10-CM

## 2013-07-15 LAB — GLUCOSE, CAPILLARY: Glucose-Capillary: 113 mg/dL — ABNORMAL HIGH (ref 70–99)

## 2013-07-15 LAB — CBC WITH DIFFERENTIAL/PLATELET
Basophils Absolute: 0 10*3/uL (ref 0.0–0.1)
Basophils Relative: 0 % (ref 0–1)
EOS ABS: 0.1 10*3/uL (ref 0.0–0.7)
Eosinophils Relative: 1 % (ref 0–5)
HCT: 37.6 % (ref 36.0–46.0)
Hemoglobin: 12.3 g/dL (ref 12.0–15.0)
Lymphocytes Relative: 12 % (ref 12–46)
Lymphs Abs: 1.3 10*3/uL (ref 0.7–4.0)
MCH: 29.4 pg (ref 26.0–34.0)
MCHC: 32.7 g/dL (ref 30.0–36.0)
MCV: 89.7 fL (ref 78.0–100.0)
MONO ABS: 1.2 10*3/uL — AB (ref 0.1–1.0)
MONOS PCT: 12 % (ref 3–12)
Neutro Abs: 7.5 10*3/uL (ref 1.7–7.7)
Neutrophils Relative %: 75 % (ref 43–77)
PLATELETS: 242 10*3/uL (ref 150–400)
RBC: 4.19 MIL/uL (ref 3.87–5.11)
RDW: 13 % (ref 11.5–15.5)
WBC: 10.1 10*3/uL (ref 4.0–10.5)

## 2013-07-15 LAB — URINALYSIS, ROUTINE W REFLEX MICROSCOPIC
GLUCOSE, UA: NEGATIVE mg/dL
KETONES UR: NEGATIVE mg/dL
Nitrite: NEGATIVE
Specific Gravity, Urine: >1.03 — ABNORMAL HIGH (ref 1.005–1.030)
Urobilinogen, UA: 1 mg/dL (ref 0.0–1.0)
pH: 6 (ref 5.0–8.0)

## 2013-07-15 LAB — BASIC METABOLIC PANEL
BUN: 19 mg/dL (ref 6–23)
CALCIUM: 9.2 mg/dL (ref 8.4–10.5)
CHLORIDE: 100 meq/L (ref 96–112)
CO2: 23 mEq/L (ref 19–32)
Creatinine, Ser: 0.76 mg/dL (ref 0.50–1.10)
GFR calc Af Amer: >90 mL/min (ref >90)
GFR calc non Af Amer: 82 mL/min — ABNORMAL LOW (ref >90)
Glucose, Bld: 170 mg/dL — ABNORMAL HIGH (ref 70–99)
Potassium: 3.8 mEq/L (ref 3.7–5.3)
SODIUM: 138 meq/L (ref 137–147)

## 2013-07-15 LAB — URINE MICROSCOPIC-ADD ON

## 2013-07-15 LAB — CBG MONITORING, ED: GLUCOSE-CAPILLARY: 163 mg/dL — AB (ref 70–99)

## 2013-07-15 MED ORDER — PHENYTOIN SODIUM EXTENDED 100 MG PO CAPS
400.0000 mg | ORAL_CAPSULE | Freq: Every day | ORAL | Status: DC
  Administered 2013-07-15 – 2013-07-18 (×4): 400 mg via ORAL
  Filled 2013-07-15 (×4): qty 4

## 2013-07-15 MED ORDER — DEXTROSE 5 % IV SOLN
1.0000 g | Freq: Once | INTRAVENOUS | Status: AC
  Administered 2013-07-15: 1 g via INTRAVENOUS
  Filled 2013-07-15: qty 10

## 2013-07-15 MED ORDER — LORATADINE 10 MG PO TABS
10.0000 mg | ORAL_TABLET | Freq: Every day | ORAL | Status: DC
  Administered 2013-07-16 – 2013-07-19 (×4): 10 mg via ORAL
  Filled 2013-07-15 (×4): qty 1

## 2013-07-15 MED ORDER — AMLODIPINE BESYLATE 5 MG PO TABS
10.0000 mg | ORAL_TABLET | Freq: Every day | ORAL | Status: DC
  Administered 2013-07-15 – 2013-07-18 (×4): 10 mg via ORAL
  Filled 2013-07-15 (×4): qty 2

## 2013-07-15 MED ORDER — HEPARIN SODIUM (PORCINE) 5000 UNIT/ML IJ SOLN
5000.0000 [IU] | Freq: Three times a day (TID) | INTRAMUSCULAR | Status: DC
  Administered 2013-07-15 – 2013-07-19 (×11): 5000 [IU] via SUBCUTANEOUS
  Filled 2013-07-15 (×10): qty 1

## 2013-07-15 MED ORDER — INSULIN ASPART 100 UNIT/ML ~~LOC~~ SOLN: 0.0000 [IU] | Freq: Every day | SUBCUTANEOUS | Status: DC

## 2013-07-15 MED ORDER — INSULIN ASPART 100 UNIT/ML ~~LOC~~ SOLN
4.0000 [IU] | Freq: Three times a day (TID) | SUBCUTANEOUS | Status: DC
  Administered 2013-07-16 – 2013-07-19 (×10): 4 [IU] via SUBCUTANEOUS

## 2013-07-15 MED ORDER — SODIUM CHLORIDE 0.9 % IV SOLN
INTRAVENOUS | Status: DC
  Administered 2013-07-15 – 2013-07-16 (×2): via INTRAVENOUS

## 2013-07-15 MED ORDER — DEXTROSE 5 % IV SOLN
1.0000 g | INTRAVENOUS | Status: DC
  Administered 2013-07-16 – 2013-07-18 (×3): 1 g via INTRAVENOUS
  Filled 2013-07-15 (×4): qty 10

## 2013-07-15 MED ORDER — ACETAMINOPHEN 500 MG PO TABS: 500.0000 mg | ORAL_TABLET | ORAL | Status: DC | PRN

## 2013-07-15 MED ORDER — ONDANSETRON HCL 4 MG/2ML IJ SOLN: 4.0000 mg | Freq: Four times a day (QID) | INTRAMUSCULAR | Status: DC | PRN

## 2013-07-15 MED ORDER — ONDANSETRON HCL 4 MG/2ML IJ SOLN
4.0000 mg | Freq: Once | INTRAMUSCULAR | Status: AC
  Administered 2013-07-15: 4 mg via INTRAVENOUS
  Filled 2013-07-15: qty 2

## 2013-07-15 MED ORDER — SIMVASTATIN 20 MG PO TABS
20.0000 mg | ORAL_TABLET | Freq: Every day | ORAL | Status: DC
  Administered 2013-07-15 – 2013-07-18 (×4): 20 mg via ORAL
  Filled 2013-07-15 (×4): qty 1

## 2013-07-15 MED ORDER — CLOPIDOGREL BISULFATE 75 MG PO TABS
75.0000 mg | ORAL_TABLET | Freq: Every day | ORAL | Status: DC
  Administered 2013-07-16 – 2013-07-19 (×4): 75 mg via ORAL
  Filled 2013-07-15 (×4): qty 1

## 2013-07-15 MED ORDER — ONDANSETRON HCL 4 MG PO TABS: 4.0000 mg | ORAL_TABLET | Freq: Four times a day (QID) | ORAL | Status: DC | PRN

## 2013-07-15 MED ORDER — INSULIN GLARGINE 100 UNIT/ML ~~LOC~~ SOLN
30.0000 [IU] | Freq: Every day | SUBCUTANEOUS | Status: DC
  Administered 2013-07-15 – 2013-07-18 (×4): 30 [IU] via SUBCUTANEOUS
  Filled 2013-07-15 (×5): qty 0.3

## 2013-07-15 MED ORDER — PANTOPRAZOLE SODIUM 40 MG PO TBEC
40.0000 mg | DELAYED_RELEASE_TABLET | Freq: Two times a day (BID) | ORAL | Status: DC
  Administered 2013-07-15 – 2013-07-19 (×8): 40 mg via ORAL
  Filled 2013-07-15 (×8): qty 1

## 2013-07-15 MED ORDER — SODIUM CHLORIDE 0.9 % IV BOLUS (SEPSIS)
500.0000 mL | Freq: Once | INTRAVENOUS | Status: AC
  Administered 2013-07-15: 500 mL via INTRAVENOUS

## 2013-07-15 MED ORDER — SODIUM CHLORIDE 0.9 % IV BOLUS (SEPSIS): 500.0000 mL | Freq: Once | INTRAVENOUS | Status: DC

## 2013-07-15 MED ORDER — LOSARTAN POTASSIUM 50 MG PO TABS
100.0000 mg | ORAL_TABLET | Freq: Every morning | ORAL | Status: DC
  Administered 2013-07-16 – 2013-07-19 (×4): 100 mg via ORAL
  Filled 2013-07-15 (×5): qty 2

## 2013-07-15 MED ORDER — INSULIN ASPART 100 UNIT/ML ~~LOC~~ SOLN
0.0000 [IU] | Freq: Three times a day (TID) | SUBCUTANEOUS | Status: DC
  Administered 2013-07-16: 5 [IU] via SUBCUTANEOUS
  Administered 2013-07-16 – 2013-07-18 (×4): 2 [IU] via SUBCUTANEOUS
  Administered 2013-07-18 – 2013-07-19 (×3): 3 [IU] via SUBCUTANEOUS

## 2013-07-15 NOTE — H&P (Signed)
[facility] History and Physical  Dorothy Carey OIZ:[id number] DOB: [birth year]    PCP:   Manon Hilding, MD   Chief Complaint: confusion, intermittently since 2 days ago.  HPI: Dorothy Carey is an 73 y.o. female with hx of DM (52 units Lantus hs, 10 units short with meals), HTN on diuretic combination pills, hx of seizure on Dilantin with thera[peutic level, prior CVA with right hemiparesis, IBS with frequent lose stool, returned to the ER after being seen yesterday for UTI, as she has intermittent confusion and having weakness.  She was seen yesterday after a fall without injury, and some confusion, found to have a UTI, and was discharged on Bactrim.  She had nausea and vomiting and was not able to take her oral meds.  She has no HA, or any change in her motor skill.  She had no slurred speech, but admitted to having chills.  She denied chest pain or SOB. Evalutiaon showed no leukocytosis, normal renal fx tests and electrolytes, and Dilantin level of 15.  Yesterday, her UA showed TNTC WBC, negative head CT and negative CXR.  These were not repeated.  Urine culture was done, and hospitalist was asked to admit her for AMS, likely due to UTI and/or fever, weakness, nausea and vomiting likely due to Bactrim, and volume depletion.  Rewiew of Systems:  Constitutional:  No significant weight loss or weight gain Eyes: Negative for eye pain, redness and discharge, diplopia, visual changes, or flashes of light. ENMT: Negative for ear pain, hoarseness, nasal congestion, sinus pressure and sore throat. No headaches; tinnitus, drooling, or problem swallowing. Cardiovascular: Negative for chest pain, palpitations, diaphoresis, dyspnea and peripheral edema. ; No orthopnea, PND Respiratory: Negative for cough, hemoptysis, wheezing and stridor. No pleuritic chestpain. Gastrointestinal: Negative for  constipation, abdominal pain, melena, blood in stool, hematemesis, jaundice and rectal bleeding.     Genitourinary: Negative for  incontinence,flank pain and hematuria; Musculoskeletal: Negative for back pain and neck pain. Negative for swelling and trauma.;  Skin: . Negative for pruritus, rash, abrasions, bruising and skin lesion.; ulcerations Neuro: Negative for headache, lightheadedness and neck stiffness. extremity weakness, burning feet, involuntary movement, seizure and syncope.  Psych: negative for anxiety, depression, insomnia, tearfulness, panic attacks, hallucinations, paranoia, suicidal or homicidal ideation    Past Medical History  Diagnosis Date  . Ruptured disk   . Internal hemorrhoid   . Hematochezia   . Seizures   . Chronic diarrhea   . IBS (irritable bowel syndrome) 10/15/2011  . Diabetes mellitus without complication   . Hypertension   . Stroke     Past Surgical History  Procedure Laterality Date  . Colonoscopy  06    ROURK  . Colonoscopy  04    FLEISHMAN  . Breast reduction surgery    . Foot surgery      Bone spurs  . Abdominal hysterectomy    . Appendectomy    . Bladder tac    . Colonoscopy N/A 01/06/2013    Procedure: COLONOSCOPY;  Surgeon: Rogene Houston, MD;  Location: [facility];  Service: Endoscopy;  Laterality: N/A;  100  . Cholecystectomy      Medications:  HOME MEDS: Prior to Admission medications   Medication Sig Start Date End Date Taking? Authorizing Provider  amLODipine (NORVASC) 10 MG tablet Take 1 tablet (10 mg total) by mouth at bedtime. 07/08/13  Yes Arnoldo Lenis, MD  clopidogrel (PLAVIX) 75 MG tablet Take 1 tablet (75 mg total) by mouth daily  with breakfast. 07/08/13  Yes Arnoldo Lenis, MD  pravastatin (PRAVACHOL) 40 MG tablet Take 1 tablet (40 mg total) by mouth daily. 07/08/13  Yes Arnoldo Lenis, MD  sulfamethoxazole-trimethoprim (BACTRIM DS) 800-160 MG per tablet Take 1 tablet by mouth 2 (two) times daily. 07/14/13  Yes Nathan R. Pickering, MD  acetaminophen (TYLENOL) 500 MG tablet Take 500 mg by mouth as needed for  pain.    Historical Provider, MD  hydrochlorothiazide (,MICROZIDE/HYDRODIURIL,) 12.5 MG capsule Take 12.5 mg by mouth every morning.     Historical Provider, MD  Insulin Detemir (LEVEMIR) 100 UNIT/ML Pen Inject 56 Units into the skin at bedtime. 01/20/13   Shanker Kristeen Mans, MD  insulin lispro (HUMALOG KWIKPEN) 100 UNIT/ML SOPN Inject 10 Units into the skin 3 (three) times daily with meals.     Historical Provider, MD  loratadine (CLARITIN) 10 MG tablet Take 10 mg by mouth daily.    Historical Provider, MD  losartan (COZAAR) 100 MG tablet Take 100 mg by mouth every morning.     Historical Provider, MD  pantoprazole (PROTONIX) 40 MG tablet Take 40 mg by mouth 2 (two) times daily.     Historical Provider, MD  phenytoin (DILANTIN) 100 MG ER capsule Take 400 mg by mouth at bedtime.     Historical Provider, MD     Allergies:  No Known Allergies  Social History:   reports that she has never smoked. She has never used smokeless tobacco. She reports that she does not drink alcohol or use illicit drugs.  Family History: Family History  Problem Relation Age of Onset  . COPD Mother   . Liver cancer Father      Physical Exam: Filed Vitals:   07/15/13 1924 07/15/13 2000 07/15/13 2030 07/15/13 2100  BP: 132/68 147/57 132/57 136/79  Pulse: 97 96 96 98  Temp:      TempSrc:      Resp: 18     SpO2: 95% 100% 95% 96%   Blood pressure 136/79, pulse 98, temperature 98.9 F (37.2 C), temperature source Oral, resp. rate 18, SpO2 96.00%.  GEN:  Pleasant  patient lying in the stretcher in no acute distress; cooperative with exam. PSYCH:  alert and oriented x4; does not appear anxious or depressed; affect is appropriate. HEENT: Mucous membranes pink and anicteric; PERRLA; EOM intact; no cervical lymphadenopathy nor thyromegaly or carotid bruit; no JVD; There were no stridor. Neck is very supple. Breasts:: Not examined CHEST WALL: No tenderness CHEST: Normal respiration, clear to auscultation  bilaterally.  HEART: Regular rate and rhythm.  There are no murmur, rub, or gallops.   BACK: No kyphosis or scoliosis; no CVA tenderness ABDOMEN: soft and non-tender; no masses, no organomegaly, normal abdominal bowel sounds; no pannus; no intertriginous candida. There is no rebound and no distention. Rectal Exam: Not done EXTREMITIES: No bone or joint deformity; age-appropriate arthropathy of the hands and knees; no edema; no ulcerations.  There is no calf tenderness. Genitalia: not examined PULSES: 2+ and symmetric SKIN: Normal hydration no rash or ulceration CNS: Cranial nerves 2-12 grossly intact no focal lateralizing neurologic deficit.  Speech is fluent; uvula elevated with phonation, facial symmetry and tongue midline. DTR are normal bilaterally, cerebella exam is intact, barbinski is negative and strengths are slightly weaker on the right. .  No sensory loss.   Labs on Admission:  Basic Metabolic Panel:  Recent Labs Lab 07/14/13 1633 07/15/13 1859  NA 141 138  K 4.0 3.8  CL 102  100  CO2 26 23  GLUCOSE 142* 170*  BUN 21 19  CREATININE 0.82 0.76  CALCIUM 9.1 9.2   Liver Function Tests: No results found for this basename: AST, ALT, ALKPHOS, BILITOT, PROT, ALBUMIN,  in the last 168 hours No results found for this basename: LIPASE, AMYLASE,  in the last 168 hours No results found for this basename: AMMONIA,  in the last 168 hours CBC:  Recent Labs Lab 07/14/13 1633 07/15/13 1859  WBC 9.6 10.1  NEUTROABS 7.5 7.5  HGB 11.8* 12.3  HCT 37.0 37.6  MCV 90.5 89.7  PLT 238 242   Cardiac Enzymes: No results found for this basename: CKTOTAL, CKMB, CKMBINDEX, TROPONINI,  in the last 168 hours  CBG:  Recent Labs Lab 07/14/13 1601 07/15/13 1947  GLUCAP 165* 163*     Radiological Exams on Admission: Dg Chest 2 View  07/14/2013   CLINICAL DATA:  Difficulty walking today with resulting fall. History of stroke.  EXAM: CHEST  2 VIEW  COMPARISON:  01/18/2013 radiographs.   FINDINGS: The heart size and mediastinal contours are stable with mild cardiac enlargement. There is improved aeration of the lungs which are now clear and mildly hyperinflated. There is no pleural effusion or pneumothorax. No fractures are identified. Degenerative changes are present throughout the thoracic spine. Telemetry leads overlie the chest.  IMPRESSION: Chronic obstructive pulmonary disease and mild cardiomegaly. No acute cardiopulmonary process.   Electronically Signed   By: Camie Patience M.D.   On: 07/14/2013 16:35   Ct Head Wo Contrast  07/14/2013   CLINICAL DATA:  Altered mental status and difficulty walking after a fall.  EXAM: CT HEAD WITHOUT CONTRAST  TECHNIQUE: Contiguous axial images were obtained from the base of the skull through the vertex without intravenous contrast.  COMPARISON:  01/18/2013  FINDINGS: There is no acute intracranial hemorrhage, acute infarction, or mass lesion.  The patient has multiple old infarcts including in the right basal ganglia, left frontal lobe, and both frontoparietal regions. There small old white matter infarcts in both corona radiata.  There is secondary encephalomalacia at the site of the infarcts. The infarcts in the left corona radiata and left frontoparietal region are new since the prior study but are not acute. There is mild diffuse atrophy with secondary slight prominence of the ventricles, stable. No osseous abnormality.  IMPRESSION: No acute intracranial abnormality. Multiple old infarcts, progressed since 01/18/2013.   Electronically Signed   By: Rozetta Nunnery M.D.   On: 07/14/2013 16:49    Assessment/Plan Present on Admission:  . Altered mental status . UTI (lower urinary tract infection) . IBS (irritable bowel syndrome) . Diarrhea . Hypertension . CVA (cerebral infarction) . Seizure disorder . Hypercholesterolemia . Weakness generalized . Type II or unspecified type diabetes mellitus without mention of complication, not stated as  uncontrolled  PLAN:  Will admit her for outpatient tx failure of UTI, causing confusion and weakness.  She likely has nausea vomiting from the bactrim.  She is a little volume depleted and is feeling weak.  Will give her some IVF, and hold her diuretics.  For her UTI, will use IV Rocephin.  Perhaps she can be discharged on a non-Sulfur base antibiotic.  For her DM, will give reduced doses of insulin (30 from 50 of Lantus and 6 from 10 units of meal coverage, with moderate SSI correction). She is stable, and her home meds were continued including meds for seizure and HTN.  She is stable, full code, and will  be admitted to [facility] service.   Thank you for asking me to participate in her care.  Other plans as per orders.  Code Status: FULL CODE.   Orvan Falconer, MD. [facility] Pager [phone] 7pm to 7am.  07/15/2013, 9:17 PM

## 2013-07-15 NOTE — ED Notes (Signed)
Pt's family states pt has been confused and "talking out of her head" x1-2 days. Pt was seen here yesterday and dx with uti. Family states symptoms have worsened since yesterday.

## 2013-07-15 NOTE — ED Provider Notes (Signed)
CSN: [id number]     Arrival date & time 07/15/13  1428 History   First MD Initiated Contact with Patient 07/15/13 1754     Chief Complaint  Patient presents with  . Altered Mental Status     (Consider location/radiation/quality/duration/timing/severity/associated sxs/prior Treatment) HPI..... level V caveat for altered mental status.   Patient was evaluated in the emergency department last night for altered mental status. She was noted to have a significant urinary tract infection and started on oral antibiotic.   CT scan of head at that time was negative. Family reports continued confusion.  She is normally independent and can perform her activities of daily living. She is also weak and having trouble ambulating. Complains of feeling hot and cold.  Past Medical History  Diagnosis Date  . Ruptured disk   . Internal hemorrhoid   . Hematochezia   . Seizures   . Chronic diarrhea   . IBS (irritable bowel syndrome) 10/15/2011  . Diabetes mellitus without complication   . Hypertension   . Stroke    Past Surgical History  Procedure Laterality Date  . Colonoscopy  06    ROURK  . Colonoscopy  04    FLEISHMAN  . Breast reduction surgery    . Foot surgery      Bone spurs  . Abdominal hysterectomy    . Appendectomy    . Bladder tac    . Colonoscopy N/A 01/06/2013    Procedure: COLONOSCOPY;  Surgeon: Rogene Houston, MD;  Location: [facility];  Service: Endoscopy;  Laterality: N/A;  100  . Cholecystectomy     Family History  Problem Relation Age of Onset  . COPD Mother   . Liver cancer Father    History  Substance Use Topics  . Smoking status: Never Smoker   . Smokeless tobacco: Never Used  . Alcohol Use: No   OB History   Grav Para Term Preterm Abortions TAB SAB Ect Mult Living                 Review of Systems  Unable to perform ROS: Mental status change      Allergies  Review of patient's allergies indicates no known allergies.  Home Medications   Prior to  Admission medications   Medication Sig Start Date End Date Taking? Authorizing Provider  amLODipine (NORVASC) 10 MG tablet Take 1 tablet (10 mg total) by mouth at bedtime. 07/08/13  Yes Arnoldo Lenis, MD  clopidogrel (PLAVIX) 75 MG tablet Take 1 tablet (75 mg total) by mouth daily with breakfast. 07/08/13  Yes Arnoldo Lenis, MD  pravastatin (PRAVACHOL) 40 MG tablet Take 1 tablet (40 mg total) by mouth daily. 07/08/13  Yes Arnoldo Lenis, MD  sulfamethoxazole-trimethoprim (BACTRIM DS) 800-160 MG per tablet Take 1 tablet by mouth 2 (two) times daily. 07/14/13  Yes Nathan R. Pickering, MD  acetaminophen (TYLENOL) 500 MG tablet Take 500 mg by mouth as needed for pain.    Historical Provider, MD  hydrochlorothiazide (,MICROZIDE/HYDRODIURIL,) 12.5 MG capsule Take 12.5 mg by mouth every morning.     Historical Provider, MD  Insulin Detemir (LEVEMIR) 100 UNIT/ML Pen Inject 56 Units into the skin at bedtime. 01/20/13   Shanker Kristeen Mans, MD  insulin lispro (HUMALOG KWIKPEN) 100 UNIT/ML SOPN Inject 10 Units into the skin 3 (three) times daily with meals.     Historical Provider, MD  loratadine (CLARITIN) 10 MG tablet Take 10 mg by mouth daily.    Historical Provider,  MD  losartan (COZAAR) 100 MG tablet Take 100 mg by mouth every morning.     Historical Provider, MD  pantoprazole (PROTONIX) 40 MG tablet Take 40 mg by mouth 2 (two) times daily.     Historical Provider, MD  phenytoin (DILANTIN) 100 MG ER capsule Take 400 mg by mouth at bedtime.     Historical Provider, MD   BP 147/57  Pulse 96  Temp(Src) 98.9 F (37.2 C) (Oral)  Resp 18  SpO2 100% Physical Exam  Nursing note and vitals reviewed. Constitutional:  Pale, slightly confused  HENT:  Head: Normocephalic and atraumatic.  Eyes: Conjunctivae and EOM are normal. Pupils are equal, round, and reactive to light.  Neck: Normal range of motion. Neck supple.  Cardiovascular: Normal rate, regular rhythm and normal heart sounds.    Pulmonary/Chest: Effort normal and breath sounds normal.  Abdominal: Soft. Bowel sounds are normal.  Musculoskeletal:  Can move all extremities  Neurological: She is alert.  Oriented to name and place.  Skin: Skin is warm and dry.  Psychiatric:  Flat affect    ED Course  Procedures (including critical care time) Labs Review Labs Reviewed  BASIC METABOLIC PANEL - Abnormal; Notable for the following:    Glucose, Bld 170 (*)    GFR calc non Af Amer 82 (*)    All other components within normal limits  CBC WITH DIFFERENTIAL - Abnormal; Notable for the following:    Monocytes Absolute 1.2 (*)    All other components within normal limits  CBG MONITORING, ED - Abnormal; Notable for the following:    Glucose-Capillary 163 (*)    All other components within normal limits  URINE CULTURE  URINALYSIS, ROUTINE W REFLEX MICROSCOPIC    Imaging Review Dg Chest 2 View  07/14/2013   CLINICAL DATA:  Difficulty walking today with resulting fall. History of stroke.  EXAM: CHEST  2 VIEW  COMPARISON:  01/18/2013 radiographs.  FINDINGS: The heart size and mediastinal contours are stable with mild cardiac enlargement. There is improved aeration of the lungs which are now clear and mildly hyperinflated. There is no pleural effusion or pneumothorax. No fractures are identified. Degenerative changes are present throughout the thoracic spine. Telemetry leads overlie the chest.  IMPRESSION: Chronic obstructive pulmonary disease and mild cardiomegaly. No acute cardiopulmonary process.   Electronically Signed   By: Camie Patience M.D.   On: 07/14/2013 16:35   Ct Head Wo Contrast  07/14/2013   CLINICAL DATA:  Altered mental status and difficulty walking after a fall.  EXAM: CT HEAD WITHOUT CONTRAST  TECHNIQUE: Contiguous axial images were obtained from the base of the skull through the vertex without intravenous contrast.  COMPARISON:  01/18/2013  FINDINGS: There is no acute intracranial hemorrhage, acute  infarction, or mass lesion.  The patient has multiple old infarcts including in the right basal ganglia, left frontal lobe, and both frontoparietal regions. There small old white matter infarcts in both corona radiata.  There is secondary encephalomalacia at the site of the infarcts. The infarcts in the left corona radiata and left frontoparietal region are new since the prior study but are not acute. There is mild diffuse atrophy with secondary slight prominence of the ventricles, stable. No osseous abnormality.  IMPRESSION: No acute intracranial abnormality. Multiple old infarcts, progressed since 01/18/2013.   Electronically Signed   By: Rozetta Nunnery M.D.   On: 07/14/2013 16:49     EKG Interpretation None      MDM   Final diagnoses:  UTI (lower urinary tract infection)  Altered mental state    I suspect her urinary tract infection as the etiology of her altered mental status. IV Rocephin. IV fluids. Discussed with hospitalist.    Nat Christen, MD 07/15/13 2101

## 2013-07-16 DIAGNOSIS — R5383 Other fatigue: Secondary | ICD-10-CM

## 2013-07-16 DIAGNOSIS — R5381 Other malaise: Secondary | ICD-10-CM

## 2013-07-16 DIAGNOSIS — I10 Essential (primary) hypertension: Secondary | ICD-10-CM

## 2013-07-16 DIAGNOSIS — I6350 Cerebral infarction due to unspecified occlusion or stenosis of unspecified cerebral artery: Secondary | ICD-10-CM

## 2013-07-16 DIAGNOSIS — G40909 Epilepsy, unspecified, not intractable, without status epilepticus: Secondary | ICD-10-CM

## 2013-07-16 LAB — COMPREHENSIVE METABOLIC PANEL
ALBUMIN: 3 g/dL — AB (ref 3.5–5.2)
ALK PHOS: 106 U/L (ref 39–117)
ALT: 20 U/L (ref 0–35)
AST: 18 U/L (ref 0–37)
BUN: 17 mg/dL (ref 6–23)
CO2: 24 mEq/L (ref 19–32)
Calcium: 8.9 mg/dL (ref 8.4–10.5)
Chloride: 106 mEq/L (ref 96–112)
Creatinine, Ser: 0.81 mg/dL (ref 0.50–1.10)
GFR calc non Af Amer: 70 mL/min — ABNORMAL LOW (ref >90)
GFR, EST AFRICAN AMERICAN: 82 mL/min — AB (ref >90)
GLUCOSE: 152 mg/dL — AB (ref 70–99)
POTASSIUM: 4 meq/L (ref 3.7–5.3)
SODIUM: 144 meq/L (ref 137–147)
TOTAL PROTEIN: 7.1 g/dL (ref 6.0–8.3)
Total Bilirubin: 0.4 mg/dL (ref 0.3–1.2)

## 2013-07-16 LAB — GLUCOSE, CAPILLARY
GLUCOSE-CAPILLARY: 126 mg/dL — AB (ref 70–99)
GLUCOSE-CAPILLARY: 88 mg/dL (ref 70–99)
Glucose-Capillary: 208 mg/dL — ABNORMAL HIGH (ref 70–99)
Glucose-Capillary: 154 mg/dL — ABNORMAL HIGH (ref 70–99)

## 2013-07-16 LAB — URINE CULTURE

## 2013-07-16 LAB — CBC
HEMATOCRIT: 33.9 % — AB (ref 36.0–46.0)
HEMOGLOBIN: 10.8 g/dL — AB (ref 12.0–15.0)
MCH: 29.1 pg (ref 26.0–34.0)
MCHC: 31.9 g/dL (ref 30.0–36.0)
MCV: 91.4 fL (ref 78.0–100.0)
Platelets: 225 10*3/uL (ref 150–400)
RBC: 3.71 MIL/uL — ABNORMAL LOW (ref 3.87–5.11)
RDW: 13.1 % (ref 11.5–15.5)
WBC: 8.7 10*3/uL (ref 4.0–10.5)

## 2013-07-16 MED ORDER — BIOTENE DRY MOUTH MT LIQD
15.0000 mL | Freq: Two times a day (BID) | OROMUCOSAL | Status: DC
  Administered 2013-07-16 – 2013-07-19 (×7): 15 mL via OROMUCOSAL

## 2013-07-16 NOTE — Plan of Care (Signed)
Problem: Phase II Progression Outcomes Goal: Voiding independently Outcome: Completed/Met Date Met:  07/16/13 incontenence

## 2013-07-16 NOTE — Progress Notes (Signed)
UR chart review completed.  

## 2013-07-16 NOTE — Evaluation (Signed)
Physical Therapy Evaluation Patient Details Name: [name] MRN: [id number] DOB: April 15, 1940 Today's Date: 07/16/2013   History of Present Illness  Dorothy Carey is an 73 y.o. female with hx of DM (52 units Lantus hs, 10 units short with meals), HTN on diuretic combination pills, hx of seizure on Dilantin with therapeutic level, prior CVA with right hemiparesis, IBS with frequent lose stool, returned to the ER after being seen yesterday for UTI, as she has intermittent confusion and having weakness.  She was seen yesterday after a fall without injury, and some confusion, found to have a UTI, and was discharged on Bactrim.  She had nausea and vomiting and was not able to take her oral meds.  She has no HA, or any change in her motor skill.  She had no slurred speech, but admitted to having chills.  She denied chest pain or SOB. Evalutiaon showed no leukocytosis, normal renal fx tests and electrolytes, and Dilantin level of 15.  Yesterday, her UA showed TNTC WBC, negative head CT and negative CXR.  These were not repeated.  Urine culture was done, and hospitalist was asked to admit her for AMS, likely due to UTI and/or fever, weakness, nausea and vomiting likely due to Bactrim, and volume depletion.  Clinical Impression  Pt daughter states that Pt was I prior to this incident.     Follow Up Recommendations SNF    Equipment Recommendations   none-has all from previous CVA    Recommendations for Other Services OT consult     Precautions / Restrictions Precautions Precautions: Fall Restrictions Weight Bearing Restrictions: No      Mobility  Bed Mobility Overal bed mobility: Needs Assistance Bed Mobility: Supine to Sit;Sit to Supine     Supine to sit: Min assist Sit to supine: Min assist      Transfers Overall transfer level: Needs assistance Equipment used: 1 person hand held assist Transfers: Sit to/from [facility] Sit to Stand: Min assist Stand pivot  transfers: Mod assist       General transfer comment: Pt very slow with movement                       Pertinent Vitals/Pain None noted    Home Living Family/patient expects to be discharged to:: Skilled nursing facility Living Arrangements: Children (Granddaughter and greatgrandson.  Daughter also states that pt would never be alone at home.) Available Help at Discharge: Family Type of Home: House Home Access: Stairs to enter Entrance Stairs-Rails: Right;Left;Can reach both Entrance Stairs-Number of Steps: 2 Home Layout: Two level;Able to live on main level with bedroom/bathroom Home Equipment: Hand held shower head;Grab bars - tub/shower;Walker - 2 wheels;Cane - single point;Shower seat      Prior Function Level of Independence: Independent               Hand Dominance   Dominant Hand: Right    Extremity/Trunk Assessment   Upper Extremity Assessment: Difficult to assess due to impaired cognition;Generalized weakness           Lower Extremity Assessment: Generalized weakness         Communication   Communication: No difficulties (confusion limiting communication)  Cognition Arousal/Alertness: Awake/alert Behavior During Therapy: WFL for tasks assessed/performed Overall Cognitive Status: Impaired/Different from baseline Area of Impairment: Memory;Following commands;Awareness;Safety/judgement;Orientation Orientation Level: Time;Disoriented to     Following Commands: Follows one step commands inconsistently       General Comments: Pt states grandchildrens names  for her own children; states the month is the "23rd"asked again pt state it is the 23rd of the 23rd.       Exercises General Exercises - Lower Extremity Heel Slides: AAROM;Both;10 reps (AA for Rt; active for Lt) Hip ABduction/ADduction: Both;10 reps (AAfor Rt; Active for LT)      Assessment/Plan    PT Assessment Patient needs continued PT services  PT Diagnosis Difficulty  walking;Generalized weakness   PT Problem List Decreased strength;Decreased activity tolerance  PT Treatment Interventions Functional mobility training;Therapeutic exercise;Gait training   PT Goals (Current goals can be found in the Care Plan section) Acute Rehab PT Goals Patient Stated Goal: None Stated    Frequency Min 3X/week           End of Session Equipment Utilized During Treatment: Gait belt Activity Tolerance: Patient limited by fatigue Patient left: in bed;with bed alarm set;with call bell/phone within reach;with family/visitor present Nurse Communication: Mobility status         Time: 1445-1513 PT Time Calculation (min): 28 min   Charges:   PT Evaluation $Initial PT Evaluation Tier I: 1 Procedure     PT G Codes:          Leeroy Cha 07/16/2013, 4:20 PM

## 2013-07-16 NOTE — Evaluation (Signed)
Occupational Therapy Evaluation Patient Details Name: Dorothy Carey MRN: [id number] DOB: 01-22-1941 Today's Date: 07/16/2013    History of Present Illness Dorothy Carey is an 73 y.o. female with hx of DM (52 units Lantus hs, 10 units short with meals), HTN on diuretic combination pills, hx of seizure on Dilantin with therapeutic level, prior CVA with right hemiparesis, IBS with frequent lose stool, returned to the ER after being seen yesterday for UTI, as she has intermittent confusion and having weakness.  She was seen yesterday after a fall without injury, and some confusion, found to have a UTI, and was discharged on Bactrim.  She had nausea and vomiting and was not able to take her oral meds.  She has no HA, or any change in her motor skill.  She had no slurred speech, but admitted to having chills.  She denied chest pain or SOB. Evalutiaon showed no leukocytosis, normal renal fx tests and electrolytes, and Dilantin level of 15.  Yesterday, her UA showed TNTC WBC, negative head CT and negative CXR.  These were not repeated.  Urine culture was done, and hospitalist was asked to admit her for AMS, likely due to UTI and/or fever, weakness, nausea and vomiting likely due to Bactrim, and volume depletion.   Clinical Impression   Pt is presenting with above situation.  She is currently very confused and had difficulty participating in OT evaluation.  Pt is weak overall in BUE and is having difficulty sequencing simple grooming tasks.  At this time pt would benefit most from SNF OT at d/c.  However, with the possibility that pt's cognition may soon clear, pt would then be appropriate for [facility] services.  Daughter is in agreement with this plan, and pt (though confused) seemed to agree after being initially tearful at mention of SNF.    Follow Up Recommendations  SNF (With pt's current condition SNF would be most approrpiate.   Pt did become tearful at mention of rehab.  If pt's cognition clears, pt would be  more appriparite for [facility] services.)    Equipment Recommendations  None recommended by OT    Recommendations for Other Services       Precautions / Restrictions Precautions Precautions: Fall Restrictions Weight Bearing Restrictions: No      Mobility Bed Mobility      Transfers          Balance                                            ADL Overall ADL's : Needs assistance/impaired     Grooming: Minimal assistance;Cueing for sequencing Grooming Details (indicate cue type and reason): Putting on lotion. Asssit for opening bottle, squeezing bottle, and intcation of where to put lotion                               General ADL Comments: Pt's confusion limited ability to assess ADL status. clinical judgement says that pt is grossly at moderate assist level.     Vision                     Perception     Praxis      Pertinent Vitals/Pain      Hand Dominance Right   Extremity/Trunk Assessment Upper Extremity Assessment Upper Extremity Assessment:  Difficult to assess due to impaired cognition;Generalized weakness   Lower Extremity Assessment Lower Extremity Assessment: Generalized weakness       Communication Communication Communication: No difficulties (confusion limiting communication)   Cognition Arousal/Alertness: Awake/alert Behavior During Therapy: WFL for tasks assessed/performed Overall Cognitive Status: Impaired/Different from baseline Area of Impairment: Memory;Following commands;Awareness;Safety/judgement;Orientation Orientation Level: Time;Disoriented to     Following Commands: Follows one step commands inconsistently         General Comments       Exercises       Shoulder Instructions      Home Living Family/patient expects to be discharged to:: Skilled nursing facility Living Arrangements: Children (Granddaughter and greatgrandson.  Daughter also states that pt would never be alone at  home.) Available Help at Discharge: Family Type of Home: House Home Access: Stairs to enter [employer] of Steps: 2 Entrance Stairs-Rails: Right;Left;Can reach both Home Layout: Two level;Able to live on main level with bedroom/bathroom     Bathroom Shower/Tub: Occupational psychologist: Handicapped height     Home Equipment: Hand held shower head;Grab bars - tub/shower;Walker - 2 wheels;Cane - single point;Shower seat          Prior Functioning/Environment Level of Independence: Independent             OT Diagnosis: Generalized weakness   OT Problem List: Decreased strength;Decreased range of motion;Decreased cognition;Decreased activity tolerance;Decreased safety awareness   OT Treatment/Interventions: Self-care/ADL training;Therapeutic exercise;Patient/family education;Cognitive remediation/compensation    OT Goals(Current goals can be found in the care plan section) Acute Rehab OT Goals Patient Stated Goal: None Stated OT Goal Formulation: With patient/family Time For Goal Achievement: 07/30/13 Potential to Achieve Goals: Good ADL Goals Pt Will Perform Grooming: with supervision Pt Will Transfer to Toilet: with supervision Pt/caregiver will Perform Home Exercise Program: Increased strength;Both right and left upper extremity  OT Frequency: Min 2X/week   Barriers to D/C:            Co-evaluation              End of Session    Activity Tolerance: Patient tolerated treatment well Patient left: in bed;with family/visitor present   Time: 0932-6712 OT Time Calculation (min): 21 min Charges:  OT General Charges $OT Visit: 1 Procedure OT Evaluation $Initial OT Evaluation Tier I: 1 Procedure G-Codes:     Bea Graff, MS, OTR/L [phone]  07/16/2013, 4:21 PM

## 2013-07-16 NOTE — Care Management Note (Addendum)
    Page 1 of 1   07/19/2013     3:57:59 PM CARE MANAGEMENT NOTE 07/19/2013  Patient:  Dorothy Carey, Dorothy Carey   Account Number:  [id number]  Date Initiated:  07/16/2013  Documentation initiated by:  Theophilus Kinds  Subjective/Objective Assessment:   Pt admitted from home with UTI/altered mental status. Pt lives alone but has a granddaughter that stays with pt intermittently. Pts son lives next door and has 2 other daughters are very active in the care of the pt. Pt has shower chair,     Action/Plan:   walker, cane, life alert button. Pt has been fairly independent with ADL's prior to hospitalization. Will continue to follow for discharge planning needs. If pt needs HH at discharge, weekend staff can arrange.   Anticipated DC Date:  07/19/2013   Anticipated DC Plan:  [place]  CM consult      Choice offered to / List presented to:             Status of service:  Completed, signed off Medicare Important Message given?  YES (If response is "NO", the following Medicare IM given date fields will be blank) Date Medicare IM given:  07/16/2013 Date Additional Medicare IM given:  07/19/2013  Discharge Disposition:  [place]  Per UR Regulation:    If discussed at Long Length of Stay Meetings, dates discussed:    Comments:  07/16/13 [place], RN BSN CM

## 2013-07-16 NOTE — Progress Notes (Signed)
[facility] PROGRESS NOTE  LORISSA KISHBAUGH YQM:[id number] DOB: 1940/12/09 DOA: 07/15/2013 PCP: Manon Hilding, MD  Assessment/Plan: . UTI (lower urinary tract infection) -Culture growing Lactobacillus -Continue Rocephin . Altered mental status -likely secondary to #1, CT scan of 5/27 negative for acute intracranial findings  . Hypertension -Okay BP controlled today, continue Norvasc  . CVA (cerebral infarction) -Continue Plavix  . Seizure disorder -Continue Dilantin, no seizures reported  . Weakness generalized -Likely secondary to #1, consult PT OT  . Type II or unspecified type diabetes mellitus without mention of complication, not stated as uncontrolled -Continue Lantus and sliding scale coverage Code Status: full Family Communication: Son and daughter at bedside Disposition Plan: To home when medically ready   Consultants:  None  Procedures:  None  Antibiotics:  Rocephin started on 5/28  HPI/Subjective: Alert and oriented x2, denies nausea or vomiting. States she feels about the same as yesterday. Family at bedside and report her mentation 'comes and goes'  Objective: Filed Vitals:   07/16/13 0638  BP: 142/62  Pulse: 87  Temp: 97.8 F (36.6 C)  Resp: 20    Intake/Output Summary (Last 24 hours) at 07/16/13 1057 Last data filed at 07/16/13 0908  Gross per 24 hour  Intake 535.83 ml  Output      0 ml  Net 535.83 ml   Filed Weights   07/15/13 2256  Weight: 69.4 kg (153 lb)    Exam:  General: alert & oriented x 2 In NAD Cardiovascular: RRR, nl S1 s2 Respiratory: CTAB Abdomen: soft +BS NT/ND, no masses palpable Extremities: No cyanosis and no edema    Data Reviewed: Basic Metabolic Panel:  Recent Labs Lab 07/14/13 1633 07/15/13 1859 07/16/13 0422  NA 141 138 144  K 4.0 3.8 4.0  CL 102 100 106  CO2 26 23 24   GLUCOSE 142* 170* 152*  BUN 21 19 17   CREATININE 0.82 0.76 0.81  CALCIUM 9.1 9.2 8.9   Liver Function Tests:  Recent  Labs Lab 07/16/13 0422  AST 18  ALT 20  ALKPHOS 106  BILITOT 0.4  PROT 7.1  ALBUMIN 3.0*   No results found for this basename: LIPASE, AMYLASE,  in the last 168 hours No results found for this basename: AMMONIA,  in the last 168 hours CBC:  Recent Labs Lab 07/14/13 1633 07/15/13 1859 07/16/13 0422  WBC 9.6 10.1 8.7  NEUTROABS 7.5 7.5  --   HGB 11.8* 12.3 10.8*  HCT 37.0 37.6 33.9*  MCV 90.5 89.7 91.4  PLT 238 242 225   Cardiac Enzymes: No results found for this basename: CKTOTAL, CKMB, CKMBINDEX, TROPONINI,  in the last 168 hours BNP (last 3 results)  Recent Labs  10/19/12 1124  PROBNP 160.7*   CBG:  Recent Labs Lab 07/14/13 1601 07/15/13 1947 07/15/13 2303 07/16/13 0930  GLUCAP 165* 163* 113* 208*    Recent Results (from the past 240 hour(s))  URINE CULTURE     Status: None   Collection Time    07/14/13  5:20 PM      Result Value Ref Range Status   Specimen Description URINE, CLEAN CATCH   Final   Special Requests NONE   Final   Culture  Setup Time     Final   Value: 07/14/2013 22:10     Performed at [place]     Final   Value: 40,000 COLONIES/ML     Performed at [facility]  Final   Value: LACTOBACILLUS SPECIES     Note: Standardized susceptibility testing for this organism is not available.     Performed at [facility]   Report Status 07/16/2013 FINAL   Final     Studies: Dg Chest 2 View  07/14/2013   CLINICAL DATA:  Difficulty walking today with resulting fall. History of stroke.  EXAM: CHEST  2 VIEW  COMPARISON:  01/18/2013 radiographs.  FINDINGS: The heart size and mediastinal contours are stable with mild cardiac enlargement. There is improved aeration of the lungs which are now clear and mildly hyperinflated. There is no pleural effusion or pneumothorax. No fractures are identified. Degenerative changes are present throughout the thoracic spine. Telemetry leads overlie the chest.   IMPRESSION: Chronic obstructive pulmonary disease and mild cardiomegaly. No acute cardiopulmonary process.   Electronically Signed   By: Camie Patience M.D.   On: 07/14/2013 16:35   Ct Head Wo Contrast  07/14/2013   CLINICAL DATA:  Altered mental status and difficulty walking after a fall.  EXAM: CT HEAD WITHOUT CONTRAST  TECHNIQUE: Contiguous axial images were obtained from the base of the skull through the vertex without intravenous contrast.  COMPARISON:  01/18/2013  FINDINGS: There is no acute intracranial hemorrhage, acute infarction, or mass lesion.  The patient has multiple old infarcts including in the right basal ganglia, left frontal lobe, and both frontoparietal regions. There small old white matter infarcts in both corona radiata.  There is secondary encephalomalacia at the site of the infarcts. The infarcts in the left corona radiata and left frontoparietal region are new since the prior study but are not acute. There is mild diffuse atrophy with secondary slight prominence of the ventricles, stable. No osseous abnormality.  IMPRESSION: No acute intracranial abnormality. Multiple old infarcts, progressed since 01/18/2013.   Electronically Signed   By: Rozetta Nunnery M.D.   On: 07/14/2013 16:49    Scheduled Meds: . amLODipine  10 mg Oral QHS  . antiseptic oral rinse  15 mL Mouth Rinse BID  . cefTRIAXone (ROCEPHIN)  IV  1 g Intravenous Q24H  . clopidogrel  75 mg Oral Q breakfast  . heparin  5,000 Units Subcutaneous 3 times per day  . insulin aspart  0-15 Units Subcutaneous TID WC  . insulin aspart  0-5 Units Subcutaneous QHS  . insulin aspart  4 Units Subcutaneous TID WC  . insulin glargine  30 Units Subcutaneous QHS  . loratadine  10 mg Oral Daily  . losartan  100 mg Oral q morning - 10a  . pantoprazole  40 mg Oral BID  . phenytoin  400 mg Oral QHS  . simvastatin  20 mg Oral q1800   Continuous Infusions: . sodium chloride 50 mL/hr at 07/15/13 2329    Principal Problem:   Altered  mental status Active Problems:   IBS (irritable bowel syndrome)   Diarrhea   Type II or unspecified type diabetes mellitus without mention of complication, not stated as uncontrolled   Hypertension   Hypercholesterolemia   CVA (cerebral infarction)   Seizure disorder   UTI (lower urinary tract infection)   Weakness generalized   Nausea and vomiting    Time spent: [facility] Pager [phone]. If 7PM-7AM, please contact night-coverage at [email or web address], password [facility] 07/16/2013, 10:57 AM  LOS: 1 day

## 2013-07-17 LAB — GLUCOSE, CAPILLARY
GLUCOSE-CAPILLARY: 113 mg/dL — AB (ref 70–99)
GLUCOSE-CAPILLARY: 153 mg/dL — AB (ref 70–99)
Glucose-Capillary: 118 mg/dL — ABNORMAL HIGH (ref 70–99)
Glucose-Capillary: 145 mg/dL — ABNORMAL HIGH (ref 70–99)

## 2013-07-17 LAB — URINE CULTURE
CULTURE: NO GROWTH
Colony Count: NO GROWTH

## 2013-07-17 NOTE — Progress Notes (Signed)
On 2L of O2 via [place] at rest pt is at  100% oxygen saturation  On RA at rest pt is at  99% oxygen saturation  On RA upon sitting pt is at  99% oxygen saturation  On RA upon standing pt is at  96% oxygen saturation  On RA upon ambulation pt is at  92% oxygen saturation  Will continue to monitor.

## 2013-07-17 NOTE — Progress Notes (Signed)
[facility] PROGRESS NOTE  Dorothy Carey [id number]:[id number] DOB: 07-30-1940 DOA: 07/15/2013 PCP: Manon Hilding, MD  Assessment/Plan: 1. Urinary tract infection. Patient is on Rocephin. Urine cultures growing lactobacillus. 2. Altered mental status. Likely related to #1. CT scan was negative for acute findings. We will need to discuss with family regarding her baseline cognition. 3. Hypertension. Blood pressure is reasonably controlled. Continue Norvasc. 4. History CVA. Continue on Plavix for secondary prevention. 5. Seizure disorder. Patient is on Dilantin. Dilantin levels are therapeutic range. No seizures reported. 6. Generalized weakness. Will need skilled nursing facility placement. We will request social work consult. 7. Type 2 diabetes. Continue Lantus and sliding scale insulin.  Code Status: full code Family Communication: no family present Disposition Plan: discharge to SNF when improved   Consultants:    Procedures:    Antibiotics:  Rocephin 5/28  HPI/Subjective: No complaints, feels that she was brought to the hospital for a "stroke". Has occasional cough. No shortness of breath  Objective: Filed Vitals:   07/17/13 0549  BP: 143/58  Pulse: 94  Temp: 97.5 F (36.4 C)  Resp: 20    Intake/Output Summary (Last 24 hours) at 07/17/13 1017 Last data filed at 07/17/13 0900  Gross per 24 hour  Intake   1640 ml  Output    850 ml  Net    790 ml   Filed Weights   07/15/13 2256  Weight: 69.4 kg (153 lb)    Exam:   General:  NAD, confused  Cardiovascular: s1, s2, rrr  Respiratory: crackles at bases  Abdomen: soft, nt, nd, bs+  Musculoskeletal: no edema b/l  Data Reviewed: Basic Metabolic Panel:  Recent Labs Lab 07/14/13 1633 07/15/13 1859 07/16/13 0422  NA 141 138 144  K 4.0 3.8 4.0  CL 102 100 106  CO2 26 23 24   GLUCOSE 142* 170* 152*  BUN 21 19 17   CREATININE 0.82 0.76 0.81  CALCIUM 9.1 9.2 8.9   Liver Function Tests:  Recent  Labs Lab 07/16/13 0422  AST 18  ALT 20  ALKPHOS 106  BILITOT 0.4  PROT 7.1  ALBUMIN 3.0*   No results found for this basename: LIPASE, AMYLASE,  in the last 168 hours No results found for this basename: AMMONIA,  in the last 168 hours CBC:  Recent Labs Lab 07/14/13 1633 07/15/13 1859 07/16/13 0422  WBC 9.6 10.1 8.7  NEUTROABS 7.5 7.5  --   HGB 11.8* 12.3 10.8*  HCT 37.0 37.6 33.9*  MCV 90.5 89.7 91.4  PLT 238 242 225   Cardiac Enzymes: No results found for this basename: CKTOTAL, CKMB, CKMBINDEX, TROPONINI,  in the last 168 hours BNP (last 3 results)  Recent Labs  10/19/12 1124  PROBNP 160.7*   CBG:  Recent Labs Lab 07/16/13 0930 07/16/13 1146 07/16/13 1646 07/16/13 2013 07/17/13 0740  GLUCAP 208* 126* 88 154* 118*    Recent Results (from the past 240 hour(s))  URINE CULTURE     Status: None   Collection Time    07/14/13  5:20 PM      Result Value Ref Range Status   Specimen Description URINE, CLEAN CATCH   Final   Special Requests NONE   Final   Culture  Setup Time     Final   Value: 07/14/2013 22:10     Performed at [place]     Final   Value: 40,000 COLONIES/ML     Performed at [facility]  Culture     Final   Value: LACTOBACILLUS SPECIES     Note: Standardized susceptibility testing for this organism is not available.     Performed at [facility]   Report Status 07/16/2013 FINAL   Final     Studies: No results found.  Scheduled Meds: . amLODipine  10 mg Oral QHS  . antiseptic oral rinse  15 mL Mouth Rinse BID  . cefTRIAXone (ROCEPHIN)  IV  1 g Intravenous Q24H  . clopidogrel  75 mg Oral Q breakfast  . heparin  5,000 Units Subcutaneous 3 times per day  . insulin aspart  0-15 Units Subcutaneous TID WC  . insulin aspart  0-5 Units Subcutaneous QHS  . insulin aspart  4 Units Subcutaneous TID WC  . insulin glargine  30 Units Subcutaneous QHS  . loratadine  10 mg Oral Daily  . losartan  100 mg  Oral q morning - 10a  . pantoprazole  40 mg Oral BID  . phenytoin  400 mg Oral QHS  . simvastatin  20 mg Oral q1800   Continuous Infusions:   Principal Problem:   Altered mental status Active Problems:   IBS (irritable bowel syndrome)   Diarrhea   Type II or unspecified type diabetes mellitus without mention of complication, not stated as uncontrolled   Hypertension   Hypercholesterolemia   CVA (cerebral infarction)   Seizure disorder   UTI (lower urinary tract infection)   Weakness generalized   Nausea and vomiting    Time spent: 35mins    Raeqwon Lux  [facility] Pager [phone]. If 7PM-7AM, please contact night-coverage at [email or web address], password [facility] 07/17/2013, 10:17 AM  LOS: 2 days

## 2013-07-18 LAB — GLUCOSE, CAPILLARY
GLUCOSE-CAPILLARY: 122 mg/dL — AB (ref 70–99)
Glucose-Capillary: 124 mg/dL — ABNORMAL HIGH (ref 70–99)
Glucose-Capillary: 163 mg/dL — ABNORMAL HIGH (ref 70–99)
Glucose-Capillary: 144 mg/dL — ABNORMAL HIGH (ref 70–99)

## 2013-07-18 LAB — BASIC METABOLIC PANEL
BUN: 16 mg/dL (ref 6–23)
CHLORIDE: 102 meq/L (ref 96–112)
CO2: 25 mEq/L (ref 19–32)
CREATININE: 0.69 mg/dL (ref 0.50–1.10)
Calcium: 9.5 mg/dL (ref 8.4–10.5)
GFR calc non Af Amer: 84 mL/min — ABNORMAL LOW (ref >90)
Glucose, Bld: 140 mg/dL — ABNORMAL HIGH (ref 70–99)
Potassium: 3.9 mEq/L (ref 3.7–5.3)
Sodium: 142 mEq/L (ref 137–147)

## 2013-07-18 LAB — CBC
HCT: 36.3 % (ref 36.0–46.0)
Hemoglobin: 12 g/dL (ref 12.0–15.0)
MCH: 29.5 pg (ref 26.0–34.0)
MCHC: 33.1 g/dL (ref 30.0–36.0)
MCV: 89.2 fL (ref 78.0–100.0)
PLATELETS: 266 10*3/uL (ref 150–400)
RBC: 4.07 MIL/uL (ref 3.87–5.11)
RDW: 12.7 % (ref 11.5–15.5)
WBC: 8.8 10*3/uL (ref 4.0–10.5)

## 2013-07-18 MED ORDER — DEXTROSE 5 % IV SOLN
INTRAVENOUS | Status: AC
  Filled 2013-07-18: qty 10

## 2013-07-18 NOTE — Progress Notes (Signed)
[facility] PROGRESS NOTE  Dorothy Carey EPP:[id number] DOB: 25-Jan-1941 DOA: 07/15/2013 PCP: Manon Hilding, MD  Assessment/Plan: 1. Urinary tract infection. Patient is on Rocephin. Urine cultures growing lactobacillus. 2. Altered mental status. Likely related to #1. CT scan was negative for acute findings. Discussed with patient's son and he feels that patient is starting to approach her baseline. 3. Hypertension. Blood pressure is reasonably controlled. Continue Norvasc. 4. History CVA. Continue on Plavix for secondary prevention. 5. Seizure disorder. Patient is on Dilantin. Dilantin levels are therapeutic range. No seizures reported. 6. Generalized weakness. Will need skilled nursing facility placement. We will request social work consult. 7. Type 2 diabetes. Continue Lantus and sliding scale insulin.  Code Status: full code Family Communication: no family present Disposition Plan: discharge to SNF when improved, will request social work consult to assist with placement   Consultants:    Procedures:    Antibiotics:  Rocephin 5/28  HPI/Subjective: No new complaints. Son at the bedside.  She knows she is in the hospital.  Thinks that she was brought to the hospital for a "stroke".   Objective: Filed Vitals:   07/18/13 1456  BP: 126/70  Pulse: 92  Temp: 98.4 F (36.9 C)  Resp: 20    Intake/Output Summary (Last 24 hours) at 07/18/13 1932 Last data filed at 07/18/13 1718  Gross per 24 hour  Intake    440 ml  Output      0 ml  Net    440 ml   Filed Weights   07/15/13 2256 07/18/13 0428  Weight: 69.4 kg (153 lb) 70.5 kg (155 lb 6.8 oz)    Exam:   General:  NAD, confused  Cardiovascular: s1, s2, rrr  Respiratory: CTA B  Abdomen: soft, nt, nd, bs+  Musculoskeletal: no edema b/l  Data Reviewed: Basic Metabolic Panel:  Recent Labs Lab 07/14/13 1633 07/15/13 1859 07/16/13 0422 07/18/13 0635  NA 141 138 144 142  K 4.0 3.8 4.0 3.9  CL 102 100  106 102  CO2 26 23 24 25   GLUCOSE 142* 170* 152* 140*  BUN 21 19 17 16   CREATININE 0.82 0.76 0.81 0.69  CALCIUM 9.1 9.2 8.9 9.5   Liver Function Tests:  Recent Labs Lab 07/16/13 0422  AST 18  ALT 20  ALKPHOS 106  BILITOT 0.4  PROT 7.1  ALBUMIN 3.0*   No results found for this basename: LIPASE, AMYLASE,  in the last 168 hours No results found for this basename: AMMONIA,  in the last 168 hours CBC:  Recent Labs Lab 07/14/13 1633 07/15/13 1859 07/16/13 0422 07/18/13 0635  WBC 9.6 10.1 8.7 8.8  NEUTROABS 7.5 7.5  --   --   HGB 11.8* 12.3 10.8* 12.0  HCT 37.0 37.6 33.9* 36.3  MCV 90.5 89.7 91.4 89.2  PLT 238 242 225 266   Cardiac Enzymes: No results found for this basename: CKTOTAL, CKMB, CKMBINDEX, TROPONINI,  in the last 168 hours BNP (last 3 results)  Recent Labs  10/19/12 1124  PROBNP 160.7*   CBG:  Recent Labs Lab 07/17/13 1641 07/17/13 2050 07/18/13 0745 07/18/13 1147 07/18/13 1642  GLUCAP 113* 153* 124* 122* 163*    Recent Results (from the past 240 hour(s))  URINE CULTURE     Status: None   Collection Time    07/14/13  5:20 PM      Result Value Ref Range Status   Specimen Description URINE, CLEAN CATCH   Final   Special Requests NONE  Final   Culture  Setup Time     Final   Value: 07/14/2013 22:10     Performed at [place] [facility]     Final   Value: 40,000 COLONIES/ML     Performed at [facility]   Culture     Final   Value: LACTOBACILLUS SPECIES     Note: Standardized susceptibility testing for this organism is not available.     Performed at [facility]   Report Status 07/16/2013 FINAL   Final  URINE CULTURE     Status: None   Collection Time    07/15/13  8:48 PM      Result Value Ref Range Status   Specimen Description URINE, CLEAN CATCH   Final   Special Requests Immunocompromised   Final   Culture  Setup Time     Final   Value: 07/16/2013 14:14     Performed at [facility]     Final   Value: NO GROWTH     Performed at [facility]   Culture     Final   Value: NO GROWTH     Performed at [facility]   Report Status 07/17/2013 FINAL   Final     Studies: No results found.  Scheduled Meds: . amLODipine  10 mg Oral QHS  . antiseptic oral rinse  15 mL Mouth Rinse BID  . cefTRIAXone (ROCEPHIN)  IV  1 g Intravenous Q24H  . clopidogrel  75 mg Oral Q breakfast  . heparin  5,000 Units Subcutaneous 3 times per day  . insulin aspart  0-15 Units Subcutaneous TID WC  . insulin aspart  0-5 Units Subcutaneous QHS  . insulin aspart  4 Units Subcutaneous TID WC  . insulin glargine  30 Units Subcutaneous QHS  . loratadine  10 mg Oral Daily  . losartan  100 mg Oral q morning - 10a  . pantoprazole  40 mg Oral BID  . phenytoin  400 mg Oral QHS  . simvastatin  20 mg Oral q1800   Continuous Infusions:   Principal Problem:   Altered mental status Active Problems:   IBS (irritable bowel syndrome)   Diarrhea   Type II or unspecified type diabetes mellitus without mention of complication, not stated as uncontrolled   Hypertension   Hypercholesterolemia   CVA (cerebral infarction)   Seizure disorder   UTI (lower urinary tract infection)   Weakness generalized   Nausea and vomiting    Time spent: 47mins    Dorothy Carey  [facility] Pager [phone]. If 7PM-7AM, please contact night-coverage at [email or web address], password [facility] 07/18/2013, 7:32 PM  LOS: 3 days

## 2013-07-18 NOTE — Progress Notes (Signed)
Patient complained of shortness of breath at 6:00AM. Checked O2 sats on room air and they were 97%. Patient ambulated in hall on room air about 30 feet, and O2 sats maintained between 96-98%. No acute distress noted. Will continue to monitor.

## 2013-07-19 LAB — BASIC METABOLIC PANEL
BUN: 16 mg/dL (ref 6–23)
CHLORIDE: 102 meq/L (ref 96–112)
CO2: 26 meq/L (ref 19–32)
CREATININE: 0.71 mg/dL (ref 0.50–1.10)
Calcium: 9.1 mg/dL (ref 8.4–10.5)
GFR calc non Af Amer: 84 mL/min — ABNORMAL LOW (ref >90)
GLUCOSE: 156 mg/dL — AB (ref 70–99)
Potassium: 3.8 mEq/L (ref 3.7–5.3)
Sodium: 141 mEq/L (ref 137–147)

## 2013-07-19 LAB — GLUCOSE, CAPILLARY
Glucose-Capillary: 164 mg/dL — ABNORMAL HIGH (ref 70–99)
Glucose-Capillary: 151 mg/dL — ABNORMAL HIGH (ref 70–99)

## 2013-07-19 LAB — CBC
HEMATOCRIT: 35.6 % — AB (ref 36.0–46.0)
HEMOGLOBIN: 11.9 g/dL — AB (ref 12.0–15.0)
MCH: 29.6 pg (ref 26.0–34.0)
MCHC: 33.4 g/dL (ref 30.0–36.0)
MCV: 88.6 fL (ref 78.0–100.0)
Platelets: 282 10*3/uL (ref 150–400)
RBC: 4.02 MIL/uL (ref 3.87–5.11)
RDW: 12.7 % (ref 11.5–15.5)
WBC: 9.6 10*3/uL (ref 4.0–10.5)

## 2013-07-19 MED ORDER — CEFUROXIME AXETIL 250 MG PO TABS: 250.0000 mg | ORAL_TABLET | Freq: Two times a day (BID) | ORAL | Status: DC

## 2013-07-19 MED ORDER — INSULIN DETEMIR 100 UNIT/ML FLEXPEN: 30.0000 [IU] | PEN_INJECTOR | Freq: Every day | SUBCUTANEOUS | Status: DC

## 2013-07-19 NOTE — Clinical Social Work Placement (Signed)
Clinical Social Work Department CLINICAL SOCIAL WORK PLACEMENT NOTE 07/19/2013  Patient:  ANAIZ, QAZI  Account Number:  [id number] Admit date:  07/15/2013  Clinical Social Worker:  Benay Pike, LCSW  Date/time:  07/19/2013 09:28 AM  Clinical Social Work is seeking post-discharge placement for this patient at the following level of care:   [place]   (*CSW will update this form in Epic as items are completed)   07/19/2013  Patient/family provided with [place] Department of Clinical Social Work's list of facilities offering this level of care within the geographic area requested by the patient (or if unable, by the patient's family).  07/19/2013  Patient/family informed of their freedom to choose among providers that offer the needed level of care, that participate in Medicare, Medicaid or managed care program needed by the patient, have an available bed and are willing to accept the patient.  07/19/2013  Patient/family informed of [facility]' ownership interest in [facility], as well as of the fact that they are under no obligation to receive care at this facility.  PASARR submitted to E[employer] on  PASARR number received from [place] on   FL2 transmitted to all facilities in geographic area requested by pt/family on  07/19/2013 FL2 transmitted to all facilities within larger geographic area on   Patient informed that his/her managed care company has contracts with or will negotiate with  certain facilities, including the following:     Patient/family informed of bed offers received:  07/19/2013 Patient chooses bed at [facility] Physician recommends and patient chooses bed at  [facility]  Patient to be transferred to [place] on  07/19/2013 Patient to be transferred to facility by family  The following physician request were entered in Epic:   Additional Comments: Pt has existing pasarr.  Benay Pike, [place]

## 2013-07-19 NOTE — Clinical Social Work Placement (Signed)
Clinical Social Work Department CLINICAL SOCIAL WORK PLACEMENT NOTE 07/19/2013  Patient:  Dorothy Carey, Dorothy Carey  Account Number:  [id number] Admit date:  07/15/2013  Clinical Social Worker:  Benay Pike, LCSW  Date/time:  07/19/2013 09:28 AM  Clinical Social Work is seeking post-discharge placement for this patient at the following level of care:   [place]   (*CSW will update this form in Epic as items are completed)   07/19/2013  Patient/family provided with [place] Department of Clinical Social Work's list of facilities offering this level of care within the geographic area requested by the patient (or if unable, by the patient's family).  07/19/2013  Patient/family informed of their freedom to choose among providers that offer the needed level of care, that participate in Medicare, Medicaid or managed care program needed by the patient, have an available bed and are willing to accept the patient.  07/19/2013  Patient/family informed of [facility]' ownership interest in [facility], as well as of the fact that they are under no obligation to receive care at this facility.  PASARR submitted to EDS on  PASARR number received from [place] on   FL2 transmitted to all facilities in geographic area requested by pt/family on  07/19/2013 FL2 transmitted to all facilities within larger geographic area on   Patient informed that his/her managed care company has contracts with or will negotiate with  certain facilities, including the following:     Patient/family informed of bed offers received:   Patient chooses bed at  Physician recommends and patient chooses bed at    Patient to be transferred to  on   Patient to be transferred to facility by   The following physician request were entered in Epic:   Additional Comments: Pt has existing pasarr.  Benay Pike, [place]

## 2013-07-19 NOTE — Progress Notes (Signed)
Patient to DC to [facility], report called to staff at receiving facility.  Patient and family aware of DC plans, family transporting patient themselves.  IV has been removed  - WNL.  No questions at this time.  Patient stable to DC, left floor via WC with NT

## 2013-07-19 NOTE — Progress Notes (Signed)
Physical Therapy Treatment Patient Details Name: Dorothy Carey MRN: [id number] DOB: 1940-12-19 Today's Date: 07/19/2013       PT Comments      Patient tolerated therapy well today, had most difficulty with RLE therex due to prior CVA hemiparesis and supine<>sit which required assistance with both trunk and LEs. Patient was able to complete 34' of gait training with RW: Min A, verbal cueing to keep head upright.                          Mobility  Bed Mobility Overal bed mobility: Needs Assistance Bed Mobility: Supine to Sit     Supine to sit: Mod assist     General bed mobility comments: Mod A with trunk with and LEs to bed  Transfers   Equipment used: Rolling walker (2 wheeled)   Sit to Stand: Min assist         General transfer comment: verbal and visual cues for proper hand placement  Ambulation/Gait Ambulation/Gait assistance: Min guard Ambulation Distance (Feet): 40 Feet Assistive device: Rolling walker (2 wheeled) Gait Pattern/deviations: Decreased stance time - right;Step-through pattern   Gait velocity interpretation: Below normal speed for age/gender                                                                           Exercises General Exercises - Lower Extremity Ankle Circles/Pumps: AROM;Right;Both;10 reps;Supine Long Arc Quad: AROM;10 reps;Both;Seated Heel Slides: AROM;Both;10 reps;Supine Hip ABduction/ADduction: AROM;Both;10 reps;Supine Hip Flexion/Marching: AROM;Both;10 reps;Seated                 PT Goals (current goals can now be found in the care plan section) Progress towards PT goals: Progressing toward goals     End of Session Equipment Utilized During Treatment: Gait belt Activity Tolerance: Patient tolerated treatment well Patient left: in chair;with call bell/phone within reach;with family/visitor present     Time: 0272-5366 PT Time Calculation (min): 21 min  Charges:  $Gait  Training: 8-22 mins $Therapeutic Exercise: 8-22 mins                    G Codes:      Molli Knock 07/19/2013, 2:09 PM

## 2013-07-19 NOTE — Discharge Summary (Signed)
Physician Discharge Summary  Dorothy Carey UDJ:[id number] DOB: 02-21-1940 DOA: 07/15/2013  PCP: Manon Hilding, MD  Admit date: 07/15/2013 Discharge date: 07/19/2013  Time spent: 40 minutes  Recommendations for Outpatient Follow-up:  1. Dr Quintin Alto PCP in 1 week for evaluation of UTI and BP control. 2. Discharged to [facility] rehab facility. Monitor BP daily for 2 weeks.   Discharge Diagnoses:  Principal Problem:   Altered mental status Active Problems:   IBS (irritable bowel syndrome)   Diarrhea   Type II or unspecified type diabetes mellitus without mention of complication, not stated as uncontrolled   Hypertension   Hypercholesterolemia   CVA (cerebral infarction)   Seizure disorder   UTI (lower urinary tract infection)   Weakness generalized   Nausea and vomiting   Discharge Condition: stable  Diet recommendation: carb modified   Filed Weights   07/15/13 2256 07/18/13 0428 07/19/13 0547  Weight: 69.4 kg (153 lb) 70.5 kg (155 lb 6.8 oz) 69.3 kg (152 lb 12.5 oz)    History of present illness:  Dorothy Carey is an 73 y.o. female with hx of DM (52 units Lantus hs, 10 units short with meals), HTN on diuretic combination pills, hx of seizure on Dilantin with thera[peutic level, prior CVA with right hemiparesis, IBS with frequent lose stool, returned to the ER on 07/15/13 after being seen on 07/15/13 for UTI, as she has intermittent confusion and having weakness. She was seen 07/14/13 after a fall without injury, and some confusion, found to have a UTI, and was discharged on Bactrim. She had nausea and vomiting and was not able to take her oral meds. She had no HA, or any change in her motor skill. She had no slurred speech, but admitted to having chills. She denied chest pain or SOB. Evalutiaon showed no leukocytosis, normal renal fx tests and electrolytes, and Dilantin level of 15. Yesterday, her UA showed TNTC WBC, negative head CT and negative CXR. These were not repeated.   Hospital  Course:  1. Urinary tract infection. Patient admitted and started on Rocephin. Urine cultures growing lactobacillus. At discharge she is afebrile and non-toxic appearing. Will discharge with ceftin for 7 more days.  2. Altered mental status. Likely related to #1. CT scan was negative for acute findings. At baseline at discharge.  3. Hypertension. Blood pressure is reasonably controlled. Continue Norvasc, losartan and HCTZ.  Chart review indicates patient seen by cardiology 07/08/13 who recommended close monitoring of BP for 2 weeks to evaluate for BP control 4. History CVA. Continue on Plavix for secondary prevention. 5. Seizure disorder. Patient is on Dilantin. Dilantin levels are therapeutic range. No seizures reported. 6. Generalized weakness. Evaluated by Physical therapy who recommend SNF 7. Type 2 diabetes. CBG range 122-164. Continue home [place] at lower dose and continue meal coverage.  8. Shortness of breath. Chart review indicates chronic. Chest xray with chronic obstructive pulmonary disease. She is at baseline at discharge.    Procedures: none Consultations:  none  Discharge Exam: Filed Vitals:   07/19/13 1406  BP: 145/87  Pulse: 98  Temp: 99.5 F (37.5 C)  Resp: 18    General: well nourished NAD Cardiovascular: RRR No MGR No LE edema Respiratory: normal effort. Faint crackles bilateral bases.   Discharge Instructions You were cared for by a hospitalist during your hospital stay. If you have any questions about your discharge medications or the care you received while you were in the hospital after you are discharged, you can call the  unit and asked to speak with the hospitalist on call if the hospitalist that took care of you is not available. Once you are discharged, your primary care physician will handle any further medical issues. Please note that NO REFILLS for any discharge medications will be authorized once you are discharged, as it is imperative that you return  to your primary care physician (or establish a relationship with a primary care physician if you do not have one) for your aftercare needs so that they can reassess your need for medications and monitor your lab values.      Discharge Instructions   Diet - low sodium heart healthy    Complete by:  As directed      Discharge instructions    Complete by:  As directed   Monitor BP daily for 2 weeks. Take BP data to PCP on follow up for evaluation of BP control.     Increase activity slowly    Complete by:  As directed             Medication List    STOP taking these medications       sulfamethoxazole-trimethoprim 800-160 MG per tablet  Commonly known as:  BACTRIM DS      TAKE these medications       acetaminophen 500 MG tablet  Commonly known as:  TYLENOL  Take 500 mg by mouth as needed for pain.     amLODipine 10 MG tablet  Commonly known as:  NORVASC  Take 1 tablet (10 mg total) by mouth at bedtime.     cefUROXime 250 MG tablet  Commonly known as:  CEFTIN  Take 1 tablet (250 mg total) by mouth 2 (two) times daily with a meal.     clopidogrel 75 MG tablet  Commonly known as:  PLAVIX  Take 1 tablet (75 mg total) by mouth daily with breakfast.     HUMALOG KWIKPEN 100 UNIT/ML KiwkPen  Generic drug:  insulin lispro  Inject 10 Units into the skin 3 (three) times daily with meals.     hydrochlorothiazide 12.5 MG capsule  Commonly known as:  MICROZIDE  Take 12.5 mg by mouth every morning.     Insulin Detemir 100 UNIT/ML Pen  Commonly known as:  LEVEMIR  Inject 30 Units into the skin at bedtime.     loratadine 10 MG tablet  Commonly known as:  CLARITIN  Take 10 mg by mouth daily.     losartan 100 MG tablet  Commonly known as:  COZAAR  Take 100 mg by mouth every morning.     pantoprazole 40 MG tablet  Commonly known as:  PROTONIX  Take 40 mg by mouth 2 (two) times daily.     phenytoin 100 MG ER capsule  Commonly known as:  DILANTIN  Take 400 mg by mouth at  bedtime.     pravastatin 40 MG tablet  Commonly known as:  PRAVACHOL  Take 1 tablet (40 mg total) by mouth daily.       Allergies  Allergen Reactions  . Bactrim [Sulfamethoxazole-Tmp Ds] Nausea And Vomiting   Follow-up Information   Follow up with Manon Hilding, MD. Schedule an appointment as soon as possible for a visit in 1 week. (for evaluation of UTI)    Specialty:  Cardiology   Contact information:   [place] [id number] [phone]        The results of significant diagnostics from this hospitalization (including imaging, microbiology, ancillary  and laboratory) are listed below for reference.    Significant Diagnostic Studies: Dg Chest 2 View  07/14/2013   CLINICAL DATA:  Difficulty walking today with resulting fall. History of stroke.  EXAM: CHEST  2 VIEW  COMPARISON:  01/18/2013 radiographs.  FINDINGS: The heart size and mediastinal contours are stable with mild cardiac enlargement. There is improved aeration of the lungs which are now clear and mildly hyperinflated. There is no pleural effusion or pneumothorax. No fractures are identified. Degenerative changes are present throughout the thoracic spine. Telemetry leads overlie the chest.  IMPRESSION: Chronic obstructive pulmonary disease and mild cardiomegaly. No acute cardiopulmonary process.   Electronically Signed   By: Camie Patience M.D.   On: 07/14/2013 16:35   Ct Head Wo Contrast  07/14/2013   CLINICAL DATA:  Altered mental status and difficulty walking after a fall.  EXAM: CT HEAD WITHOUT CONTRAST  TECHNIQUE: Contiguous axial images were obtained from the base of the skull through the vertex without intravenous contrast.  COMPARISON:  01/18/2013  FINDINGS: There is no acute intracranial hemorrhage, acute infarction, or mass lesion.  The patient has multiple old infarcts including in the right basal ganglia, left frontal lobe, and both frontoparietal regions. There small old white matter infarcts in both  corona radiata.  There is secondary encephalomalacia at the site of the infarcts. The infarcts in the left corona radiata and left frontoparietal region are new since the prior study but are not acute. There is mild diffuse atrophy with secondary slight prominence of the ventricles, stable. No osseous abnormality.  IMPRESSION: No acute intracranial abnormality. Multiple old infarcts, progressed since 01/18/2013.   Electronically Signed   By: Rozetta Nunnery M.D.   On: 07/14/2013 16:49    Microbiology: Recent Results (from the past 240 hour(s))  URINE CULTURE     Status: None   Collection Time    07/14/13  5:20 PM      Result Value Ref Range Status   Specimen Description URINE, CLEAN CATCH   Final   Special Requests NONE   Final   Culture  Setup Time     Final   Value: 07/14/2013 22:10     Performed at [facility]     Final   Value: 40,000 COLONIES/ML     Performed at [facility]   Culture     Final   Value: LACTOBACILLUS SPECIES     Note: Standardized susceptibility testing for this organism is not available.     Performed at [facility]   Report Status 07/16/2013 FINAL   Final  URINE CULTURE     Status: None   Collection Time    07/15/13  8:48 PM      Result Value Ref Range Status   Specimen Description URINE, CLEAN CATCH   Final   Special Requests Immunocompromised   Final   Culture  Setup Time     Final   Value: 07/16/2013 14:14     Performed at [facility]     Final   Value: NO GROWTH     Performed at [facility]   Culture     Final   Value: NO GROWTH     Performed at [facility]   Report Status 07/17/2013 FINAL   Final     Labs: Basic Metabolic Panel:  Recent Labs Lab 07/14/13 1633 07/15/13 1859 07/16/13 0422 07/18/13 0635 07/19/13 0531  NA 141  138 144 142 141  K 4.0 3.8 4.0 3.9 3.8  CL 102 100 106 102 102  CO2 26 23 24 25 26   GLUCOSE 142* 170* 152* 140* 156*  BUN 21 19 17 16  16   CREATININE 0.82 0.76 0.81 0.69 0.71  CALCIUM 9.1 9.2 8.9 9.5 9.1   Liver Function Tests:  Recent Labs Lab 07/16/13 0422  AST 18  ALT 20  ALKPHOS 106  BILITOT 0.4  PROT 7.1  ALBUMIN 3.0*   No results found for this basename: LIPASE, AMYLASE,  in the last 168 hours No results found for this basename: AMMONIA,  in the last 168 hours CBC:  Recent Labs Lab 07/14/13 1633 07/15/13 1859 07/16/13 0422 07/18/13 0635 07/19/13 0531  WBC 9.6 10.1 8.7 8.8 9.6  NEUTROABS 7.5 7.5  --   --   --   HGB 11.8* 12.3 10.8* 12.0 11.9*  HCT 37.0 37.6 33.9* 36.3 35.6*  MCV 90.5 89.7 91.4 89.2 88.6  PLT 238 242 225 266 282   Cardiac Enzymes: No results found for this basename: CKTOTAL, CKMB, CKMBINDEX, TROPONINI,  in the last 168 hours BNP: BNP (last 3 results)  Recent Labs  10/19/12 1124  PROBNP 160.7*   CBG:  Recent Labs Lab 07/18/13 1147 07/18/13 1642 07/18/13 2011 07/19/13 0738 07/19/13 1121  GLUCAP 122* 163* 144* 164* 151*       Signed:  Lezlie Octave Ronel Rodeheaver  [facility] 07/19/2013, 2:30 PM

## 2013-07-19 NOTE — Clinical Social Work Note (Signed)
CSW presented bed offer to pt's daughter, Manuela Schwartz who accepts [place]. Facility notified and has received [employer] authorization. Daughter requests to transport pt. Planned d/c for this afternoon. Facility aware and agreeable. CSW will fax d/c summary upon completion.   Benay Pike, [place]

## 2013-07-19 NOTE — Clinical Social Work Psychosocial (Signed)
Clinical Social Work Department BRIEF PSYCHOSOCIAL ASSESSMENT 07/19/2013  Patient:  Dorothy Carey, Dorothy Carey     Account Number:  [id number]     Admit date:  07/15/2013  Clinical Social Worker:  Wyatt Haste  Date/Time:  07/19/2013 09:31 AM  Referred by:  Physician  Date Referred:  07/19/2013 Referred for  SNF Placement   Other Referral:   Interview type:  Patient Other interview type:   daughter- Manuela Schwartz    PSYCHOSOCIAL DATA Living Status:  FAMILY Admitted from facility:   Level of care:   Primary support name:  Manuela Schwartz Primary support relationship to patient:  CHILD, ADULT Degree of support available:   supportive    CURRENT CONCERNS Current Concerns  Post-Acute Placement   Other Concerns:    SOCIAL WORK ASSESSMENT / PLAN CSW met with pt at bedside. Pt alert and participated in assessment, but had some confusion. CSW called pt's daughter, Manuela Schwartz who reports she is HCPOA to discuss d/c planning as well due to confusion. Pt said she lives alone, but Manuela Schwartz reports pt's granddaughter lives with her. Family is with her around the clock when needed. However, pt had been doing better until recently and she was able to be alone some. Pt states she has 4 children. Manuela Schwartz lives nearby and appears to be involved and supportive. Pt fell a few days ago. Family noticed increased confusion and brought pt to ED. She returned home with an antibiotic for UTI, but then family noticed pt was very weak and still confused. When they came back to ED, pt was admitted. Pt was evaluated by PT on Friday and recommendation is for SNF. CSW discussed this with pt and daughter. Pt went to [facility] in December after a stroke and stayed for a few weeks. Manuela Schwartz states they are open to [facility] or [place], but pt needs a private room as a semi-private has not worked for them in the past. Manuela Schwartz appeared to be somewhat conflicted on her wishes for d/c. She would like pt to return home, but in pt's current condition she feels that she  will need SNF. Pt did very well at SNF last time and progressed to walking without assistive device after returning home. Pt and Manuela Schwartz are aware of [employer] authorization process.   Assessment/plan status:  Psychosocial Support/Ongoing Assessment of Needs Other assessment/ plan:   Information/referral to community resources:   SNF list    PATIENT'S/FAMILY'S RESPONSE TO PLAN OF CARE: Pt and daughter agreeable to SNF at [facility] or [place]. CSW will initiate bed search and [facility] authorization.      Benay Pike, [place]

## 2013-07-19 NOTE — Discharge Summary (Signed)
The patient was seen and examined. She was discussed with nurse practitioner, Ms. Renard Hamper. Agree with her assessment and plan. Recommend followup monitoring of her blood pressure. Recommend checking her blood glucose at least 2-3 times daily.  Discharge diagnoses:  1. Altered mental status/acute encephalopathy secondary to urinary tract infection. 2. Urinary tract infection. Urine culture negative to date, but likely sterile from antibiotics given prior to hospitalization. 3. Generalized weakness, nausea, and vomiting, secondary to the urinary tract infection. Resolved. 4. Seizure disorder. Remains stable on Dilantin. Phenytoin level was therapeutic. 5. Type 2 diabetes mellitus, remained stable. 6. Durable bowel syndrome with diarrhea component. 7. History of CVA, stable. 8. Hypercholesterolemia. Stable. 9. Hypertension. Stable.

## 2013-08-25 ENCOUNTER — Encounter (INDEPENDENT_AMBULATORY_CARE_PROVIDER_SITE_OTHER): Payer: Self-pay | Admitting: *Deleted

## 2013-09-18 ENCOUNTER — Emergency Department (HOSPITAL_COMMUNITY)
Admission: EM | Admit: 2013-09-18 | Discharge: 2013-09-18 | Disposition: A | Discharge status: Home / Self Care | Payer: Medicare Other | Attending: Emergency Medicine | Admitting: Emergency Medicine

## 2013-09-18 ENCOUNTER — Encounter (HOSPITAL_COMMUNITY): Payer: Self-pay | Admitting: Emergency Medicine

## 2013-09-18 DIAGNOSIS — Z79899 Other long term (current) drug therapy: Secondary | ICD-10-CM | POA: Diagnosis not present

## 2013-09-18 DIAGNOSIS — Z794 Long term (current) use of insulin: Secondary | ICD-10-CM | POA: Insufficient documentation

## 2013-09-18 DIAGNOSIS — R5383 Other fatigue: Secondary | ICD-10-CM

## 2013-09-18 DIAGNOSIS — Z8673 Personal history of transient ischemic attack (TIA), and cerebral infarction without residual deficits: Secondary | ICD-10-CM | POA: Insufficient documentation

## 2013-09-18 DIAGNOSIS — E119 Type 2 diabetes mellitus without complications: Secondary | ICD-10-CM | POA: Diagnosis not present

## 2013-09-18 DIAGNOSIS — Z8719 Personal history of other diseases of the digestive system: Secondary | ICD-10-CM | POA: Diagnosis not present

## 2013-09-18 DIAGNOSIS — G40909 Epilepsy, unspecified, not intractable, without status epilepticus: Secondary | ICD-10-CM | POA: Insufficient documentation

## 2013-09-18 DIAGNOSIS — R5381 Other malaise: Secondary | ICD-10-CM | POA: Diagnosis present

## 2013-09-18 DIAGNOSIS — N390 Urinary tract infection, site not specified: Secondary | ICD-10-CM | POA: Diagnosis not present

## 2013-09-18 DIAGNOSIS — I10 Essential (primary) hypertension: Secondary | ICD-10-CM | POA: Insufficient documentation

## 2013-09-18 LAB — CBC WITH DIFFERENTIAL/PLATELET
Basophils Absolute: 0.1 10*3/uL (ref 0.0–0.1)
Basophils Relative: 1 % (ref 0–1)
EOS ABS: 0.1 10*3/uL (ref 0.0–0.7)
EOS PCT: 1 % (ref 0–5)
HCT: 34.3 % — ABNORMAL LOW (ref 36.0–46.0)
Hemoglobin: 11.4 g/dL — ABNORMAL LOW (ref 12.0–15.0)
Lymphocytes Relative: 18 % (ref 12–46)
Lymphs Abs: 1.6 10*3/uL (ref 0.7–4.0)
MCH: 29.8 pg (ref 26.0–34.0)
MCHC: 33.2 g/dL (ref 30.0–36.0)
MCV: 89.6 fL (ref 78.0–100.0)
Monocytes Absolute: 0.9 10*3/uL (ref 0.1–1.0)
Monocytes Relative: 11 % (ref 3–12)
NEUTROS PCT: 69 % (ref 43–77)
Neutro Abs: 6.2 10*3/uL (ref 1.7–7.7)
Platelets: 309 10*3/uL (ref 150–400)
RBC: 3.83 MIL/uL — ABNORMAL LOW (ref 3.87–5.11)
RDW: 13.3 % (ref 11.5–15.5)
WBC: 8.9 10*3/uL (ref 4.0–10.5)

## 2013-09-18 LAB — URINE MICROSCOPIC-ADD ON

## 2013-09-18 LAB — BASIC METABOLIC PANEL
ANION GAP: 10 (ref 5–15)
BUN: 16 mg/dL (ref 6–23)
CALCIUM: 9.8 mg/dL (ref 8.4–10.5)
CO2: 28 mEq/L (ref 19–32)
Chloride: 101 mEq/L (ref 96–112)
Creatinine, Ser: 0.59 mg/dL (ref 0.50–1.10)
GFR calc Af Amer: >90 mL/min (ref >90)
GFR, EST NON AFRICAN AMERICAN: 88 mL/min — AB (ref >90)
Glucose, Bld: 150 mg/dL — ABNORMAL HIGH (ref 70–99)
Potassium: 5.2 mEq/L (ref 3.7–5.3)
SODIUM: 139 meq/L (ref 137–147)

## 2013-09-18 LAB — URINALYSIS, ROUTINE W REFLEX MICROSCOPIC
Bilirubin Urine: NEGATIVE
Glucose, UA: NEGATIVE mg/dL
Ketones, ur: NEGATIVE mg/dL
NITRITE: NEGATIVE
PROTEIN: 100 mg/dL — AB
Specific Gravity, Urine: 1.02 (ref 1.005–1.030)
Urobilinogen, UA: 0.2 mg/dL (ref 0.0–1.0)
pH: 7 (ref 5.0–8.0)

## 2013-09-18 MED ORDER — DEXTROSE 5 % IV SOLN
1.0000 g | Freq: Once | INTRAVENOUS | Status: AC
  Administered 2013-09-18: 1 g via INTRAVENOUS
  Filled 2013-09-18: qty 10

## 2013-09-18 MED ORDER — CEPHALEXIN 500 MG PO CAPS: 500.0000 mg | ORAL_CAPSULE | Freq: Four times a day (QID) | ORAL | Status: AC

## 2013-09-18 NOTE — ED Notes (Signed)
Pt c/o not feeling well. Pt daughters reports thick vaginal d/c and strong smelling urine, and n/v x 1 episode today.

## 2013-09-18 NOTE — ED Provider Notes (Signed)
CSN: [id number]     Arrival date & time 09/18/13  1804 History  This chart was scribed for Shaune Pollack, MD by Girtha Hake, ED Scribe. The patient was seen in [place]. The patient's care was started at 9:02 PM.     Chief Complaint  Patient presents with  . Fatigue    The history is provided by a relative. No language interpreter was used.   HPI Comments: Dorothy Carey is a 73 y.o. female with a history of stroke who presents to the Emergency Department complaining of a possible UTI. Patient's daughter reports that the patient has been more confused than normal today. She also noticed a bad smell when the patient urinates and "thick" vaginal discharge when changing her diaper this morning. Daughter reports that the patient ate a normal lunch, but vomited shortly after eating. Denies fever. Patient is paralyzed on her right side from a stroke she had three months ago. Patient is nonambulatory.  PCP is Dr. Quintin Alto.   Past Medical History  Diagnosis Date  . Ruptured disk   . Internal hemorrhoid   . Hematochezia   . Seizures   . Chronic diarrhea   . IBS (irritable bowel syndrome) 10/15/2011  . Diabetes mellitus without complication   . Hypertension   . Stroke    Past Surgical History  Procedure Laterality Date  . Colonoscopy  06    ROURK  . Colonoscopy  04    FLEISHMAN  . Breast reduction surgery    . Foot surgery      Bone spurs  . Abdominal hysterectomy    . Appendectomy    . Bladder tac    . Colonoscopy N/A 01/06/2013    Procedure: COLONOSCOPY;  Surgeon: Rogene Houston, MD;  Location: [facility];  Service: Endoscopy;  Laterality: N/A;  100  . Cholecystectomy    . Cholecystectomy     Family History  Problem Relation Age of Onset  . COPD Mother   . Liver cancer Father    History  Substance Use Topics  . Smoking status: Never Smoker   . Smokeless tobacco: Never Used  . Alcohol Use: No   OB History   Grav Para Term Preterm Abortions TAB SAB Ect Mult Living                  Review of Systems  Constitutional: Negative for fever.  Gastrointestinal: Positive for vomiting.  Genitourinary: Positive for vaginal discharge.  Psychiatric/Behavioral: Positive for confusion.  All other systems reviewed and are negative.     Allergies  Bactrim  Home Medications   Prior to Admission medications   Medication Sig Start Date End Date Taking? Authorizing Provider  acetaminophen (TYLENOL) 500 MG tablet Take 500 mg by mouth as needed for pain.    Historical Provider, MD  amLODipine (NORVASC) 10 MG tablet Take 1 tablet (10 mg total) by mouth at bedtime. 07/08/13   Arnoldo Lenis, MD  cefUROXime (CEFTIN) 250 MG tablet Take 1 tablet (250 mg total) by mouth 2 (two) times daily with a meal. 07/19/13   Radene Gunning, NP  clopidogrel (PLAVIX) 75 MG tablet Take 1 tablet (75 mg total) by mouth daily with breakfast. 07/08/13   Arnoldo Lenis, MD  hydrochlorothiazide (,MICROZIDE/HYDRODIURIL,) 12.5 MG capsule Take 12.5 mg by mouth every morning.     Historical Provider, MD  Insulin Detemir (LEVEMIR) 100 UNIT/ML Pen Inject 30 Units into the skin at bedtime. 07/19/13   Lezlie Octave  Black, NP  insulin lispro (HUMALOG KWIKPEN) 100 UNIT/ML SOPN Inject 10 Units into the skin 3 (three) times daily with meals.     Historical Provider, MD  loratadine (CLARITIN) 10 MG tablet Take 10 mg by mouth daily.    Historical Provider, MD  losartan (COZAAR) 100 MG tablet Take 100 mg by mouth every morning.     Historical Provider, MD  pantoprazole (PROTONIX) 40 MG tablet Take 40 mg by mouth 2 (two) times daily.     Historical Provider, MD  phenytoin (DILANTIN) 100 MG ER capsule Take 400 mg by mouth at bedtime.     Historical Provider, MD  pravastatin (PRAVACHOL) 40 MG tablet Take 1 tablet (40 mg total) by mouth daily. 07/08/13   Arnoldo Lenis, MD   Triage: BP 146/82  Pulse 98  Temp(Src) 97.3 F (36.3 C) (Oral)  Resp 16  Ht 5\' 3"  (1.6 m)  Wt 145 lb (65.772 kg)  BMI 25.69 kg/m2  SpO2  98% Physical Exam  Nursing note and vitals reviewed. Constitutional: She appears well-developed and well-nourished. No distress.  HENT:  Head: Normocephalic and atraumatic.  Right Ear: External ear normal.  Left Ear: External ear normal.  Nose: Nose normal.  Mouth/Throat: Oropharynx is clear and moist.  Eyes: Conjunctivae are normal. Pupils are equal, round, and reactive to light.  Neck: Normal range of motion. Neck supple.  Cardiovascular: Normal rate and regular rhythm.   Pulmonary/Chest: Effort normal and breath sounds normal. No respiratory distress. She has no wheezes. She has no rales.  Abdominal: There is tenderness (diffuse, right-sided tenderness).  Neurological: She is alert.  Diffuse, right-sided weakness consistent with prior stroke.  Skin: Skin is warm and dry.    ED Course  Procedures (including critical care time) DIAGNOSTIC STUDIES: Oxygen Saturation is 97% on room air, normal by my interpretation.    COORDINATION OF CARE: 9:07 PM-Discussed treatment plan which includes and In and Out Cathete and labs with pt's daughter at bedside and pt's daughter agreed to plan.      Labs Review Labs Reviewed  URINALYSIS, ROUTINE W REFLEX MICROSCOPIC - Abnormal; Notable for the following:    Hgb urine dipstick TRACE (*)    Protein, ur 100 (*)    Leukocytes, UA SMALL (*)    All other components within normal limits  CBC WITH DIFFERENTIAL - Abnormal; Notable for the following:    RBC 3.83 (*)    Hemoglobin 11.4 (*)    HCT 34.3 (*)    All other components within normal limits  URINE MICROSCOPIC-ADD ON - Abnormal; Notable for the following:    Squamous Epithelial / LPF MANY (*)    Bacteria, UA MANY (*)    All other components within normal limits  BASIC METABOLIC PANEL    Imaging Review No results found.   EKG Interpretation None      MDM   Final diagnoses:  UTI (lower urinary tract infection)    History of stroke with right-sided weakness, presents today  with foul smelling urine and urethral discharge. Urinalysis is significant for too numerous to count white blood cells.  She is not febrile, no vomiting is present, Filed Vitals:   09/18/13 2230  BP: 146/69  Pulse: 99  Temp:   Resp:     I personally performed the services described in this documentation, which was scribed in my presence. The recorded information has been reviewed and considered.   Shaune Pollack, MD 09/18/13 [phone]

## 2013-09-18 NOTE — ED Notes (Signed)
Thick white colored discharge was noted around the urinary meatus and vagina.

## 2013-09-18 NOTE — ED Notes (Signed)
Family reporting pt "appears to be having some difficulty breathing."  Pt assessed and does have occasional signing respirations.  Heart rate 102.  Oxygen saturation 100% on room air.

## 2013-09-18 NOTE — Discharge Instructions (Signed)

## 2013-09-22 ENCOUNTER — Other Ambulatory Visit (HOSPITAL_COMMUNITY): Payer: Self-pay | Admitting: Family Medicine

## 2013-09-22 DIAGNOSIS — I639 Cerebral infarction, unspecified: Secondary | ICD-10-CM

## 2013-10-05 ENCOUNTER — Ambulatory Visit (HOSPITAL_COMMUNITY)
Admission: RE | Admit: 2013-10-05 | Discharge: 2013-10-05 | Disposition: A | Discharge status: Home / Self Care | Payer: Medicare Other | Source: Ambulatory Visit | Attending: Family Medicine | Admitting: Family Medicine

## 2013-10-05 DIAGNOSIS — I10 Essential (primary) hypertension: Secondary | ICD-10-CM | POA: Diagnosis not present

## 2013-10-05 DIAGNOSIS — R1311 Dysphagia, oral phase: Secondary | ICD-10-CM | POA: Diagnosis not present

## 2013-10-05 DIAGNOSIS — I639 Cerebral infarction, unspecified: Secondary | ICD-10-CM

## 2013-10-05 DIAGNOSIS — E119 Type 2 diabetes mellitus without complications: Secondary | ICD-10-CM | POA: Diagnosis not present

## 2013-10-05 DIAGNOSIS — Z8673 Personal history of transient ischemic attack (TIA), and cerebral infarction without residual deficits: Secondary | ICD-10-CM | POA: Insufficient documentation

## 2013-10-05 DIAGNOSIS — R1310 Dysphagia, unspecified: Secondary | ICD-10-CM | POA: Insufficient documentation

## 2013-10-05 NOTE — Procedures (Signed)
Objective Swallowing Evaluation: Modified Barium Swallowing Study   Patient Details  Name: Dorothy Carey MRN: [id number] Date of Birth: Oct 16, 1940  Today's Date: 10/05/2013 Time: 9:38 AM  - 10:00 AM    Past Medical History:  Past Medical History  Diagnosis Date  . Ruptured disk   . Internal hemorrhoid   . Hematochezia   . Seizures   . Chronic diarrhea   . IBS (irritable bowel syndrome) 10/15/2011  . Diabetes mellitus without complication   . Hypertension   . Stroke    Past Surgical History:  Past Surgical History  Procedure Laterality Date  . Colonoscopy  06    ROURK  . Colonoscopy  04    FLEISHMAN  . Breast reduction surgery    . Foot surgery      Bone spurs  . Abdominal hysterectomy    . Appendectomy    . Bladder tac    . Colonoscopy N/A 01/06/2013    Procedure: COLONOSCOPY;  Surgeon: Rogene Houston, MD;  Location: [facility];  Service: Endoscopy;  Laterality: N/A;  100  . Cholecystectomy    . Cholecystectomy     HPI:  [name] is a 73 yo female referred for MBSS following diet alteration from previous stroke. Pt was admitted to [facility] in December 2014 for CVA and discharged to [facility]. She was readmitted to [facility] 07/14/2013 for possible UTI and discharged to [facility] facility again. She was been receiving home health SLP therapy ([facility]) and consuming a mechanical soft diet and honey-thick liquids. Pt is anxious about today's study and actually vomited prior to SLP entering room.      Recommendation/Prognosis  Clinical Impression:   Dysphagia Diagnosis: Mild oral phase dysphagia;Mild pharyngeal phase dysphagia Clinical impression: Mild oropharyngeal phase dysphagia characterized by mildly decreased oral control/lingual manipulation resulting in premature spillage of liquids to the valleculae (to the pyriforms with mixed consistencies and sequential straw sips); mild decreased tongue base retraction with mild residuals in valleculae post  swallow (with nectars and thin). Tongue base retraction seems to improve with heavier bolus (puree and regular). Pt had 2 episodes of penetration when taking thins by straw and when attempting to swallow the pill with thin. Pt was unable to propel pill posteriorly in oral cavity with liquids, so the pill was then presented in puree however pt masticated the pill. Recommend D3/mech soft (trials regular) with thin liquids, no straws. Crush pills in puree or whole in puree.    Swallow Evaluation Recommendations:  Diet Recommendations: Dysphagia 3 (Mechanical Soft);Thin liquid Liquid Administration via: Cup;No straw Medication Administration: Crushed with puree Supervision: Patient able to self feed;Intermittent supervision to cue for compensatory strategies Compensations: Slow rate;Small sips/bites Postural Changes and/or Swallow Maneuvers: Out of bed for meals;Seated upright 90 degrees;Upright 30-60 min after meal Oral Care Recommendations: Oral care BID Other Recommendations: Clarify dietary restrictions Follow up Recommendations: Home health SLP    Prognosis:  Prognosis for Safe Diet Advancement: Good   Individuals Consulted: Consulted and Agree with Results and Recommendations: Patient;Family member/caregiver Family Member Consulted: son Report Sent to : Referring physician;Primary SLP    General: Date of Onset: 08/18/13 Type of Study: Modified Barium Swallowing Study Reason for Referral: Objectively evaluate swallowing function Previous Swallow Assessment: None on record; objective tests at [facility]? Diet Prior to this Study: Dysphagia 3 (soft);Honey-thick liquids Temperature Spikes Noted: No Respiratory Status: Room air History of Recent Intubation: No Behavior/Cognition: Alert;Cooperative Oral Cavity - Dentition: Adequate natural dentition Oral Motor /  Sensory Function: Impaired motor Self-Feeding Abilities: Needs assist Patient Positioning: Upright in chair Baseline Vocal  Quality: Clear Volitional Cough: Strong Volitional Swallow: Able to elicit Anatomy: Within functional limits Pharyngeal Secretions: Not observed secondary MBS   Reason for Referral:   Objectively evaluate swallowing function    Oral Phase: Oral Preparation/Oral Phase Oral Phase: Impaired Oral - Solids Oral - Regular: Weak lingual manipulation;Reduced posterior propulsion;Lingual/palatal residue;Piecemeal swallowing;Delayed oral transit Oral - Multi-consistency: Weak lingual manipulation;Holding of bolus Oral - Pill: Weak lingual manipulation;Reduced posterior propulsion;Holding of bolus;Left pocketing in lateral sulci Oral Phase - Comment Oral Phase - Comment: (mild) decreased oral control/coordination in preparation for swallow   Pharyngeal Phase:  Pharyngeal Phase Pharyngeal Phase: Impaired Pharyngeal - Nectar Pharyngeal - Nectar Cup: Pharyngeal residue - valleculae;Premature spillage to valleculae Pharyngeal - Thin Pharyngeal - Thin Cup: Premature spillage to valleculae;Pharyngeal residue - valleculae Pharyngeal - Thin Straw: Premature spillage to valleculae;Premature spillage to pyriform sinuses;Penetration/Aspiration during swallow;Pharyngeal residue - valleculae Penetration/Aspiration details (thin straw): Material enters airway, remains ABOVE vocal cords then ejected out   Cervical Esophageal Phase  Cervical Esophageal Phase Cervical Esophageal Phase: [facility]   GN  Functional Assessment Tool Used: mbss Functional Limitations: Swallowing Swallow Current Status (Y8502): At least 1 percent but less than 20 percent impaired, limited or restricted Swallow Goal Status [phone]): At least 1 percent but less than 20 percent impaired, limited or restricted Swallow Discharge Status [phone]): At least 1 percent but less than 20 percent impaired, limited or restricted      Thank you,  Genene Churn, [place]  [place] 10/05/2013, 12:31 PM

## 2013-10-07 ENCOUNTER — Ambulatory Visit (HOSPITAL_COMMUNITY): Payer: Medicare Other | Admitting: Speech Pathology

## 2013-10-07 ENCOUNTER — Encounter (HOSPITAL_COMMUNITY): Payer: Medicare Other | Admitting: Speech Pathology

## 2013-12-14 ENCOUNTER — Ambulatory Visit (INDEPENDENT_AMBULATORY_CARE_PROVIDER_SITE_OTHER): Payer: Medicare Other | Admitting: Internal Medicine

## 2013-12-15 ENCOUNTER — Telehealth (INDEPENDENT_AMBULATORY_CARE_PROVIDER_SITE_OTHER): Payer: Self-pay | Admitting: *Deleted

## 2013-12-15 ENCOUNTER — Encounter (INDEPENDENT_AMBULATORY_CARE_PROVIDER_SITE_OTHER): Payer: Self-pay | Admitting: *Deleted

## 2013-12-15 NOTE — Telephone Encounter (Signed)
Dorothy Carey SHOWED for her apt with Deberah Castle, NP on 12/14/13. A NS letter has been mailed.

## 2013-12-19 DEATH — deceased

## 2014-07-14 IMAGING — CT CT ANGIO CHEST
2 of 6 series · 19 of 36 positions shown · IV contrast (Omnipaque 300)
Comparison: 09/15/2010

CLINICAL DATA: Short of breath and chest pain

CT ANGIOGRAPHY CHEST
TECHNIQUE: Multidetector CT imaging of the chest using the
standard protocol during bolus administration of intravenous
contrast. Multiplanar reconstructed images including MIPs were
obtained and reviewed to evaluate the vascular anatomy.
Contrast: 80mL OMNIPAQUE IOHEXOL 350 MG/ML SOLN

[Series 5: thins (id) / (id) · axial · 0.68mm/px · z∈[-276,-42]mm · 18 of 261 slices shown]
[im 14/261  lung]
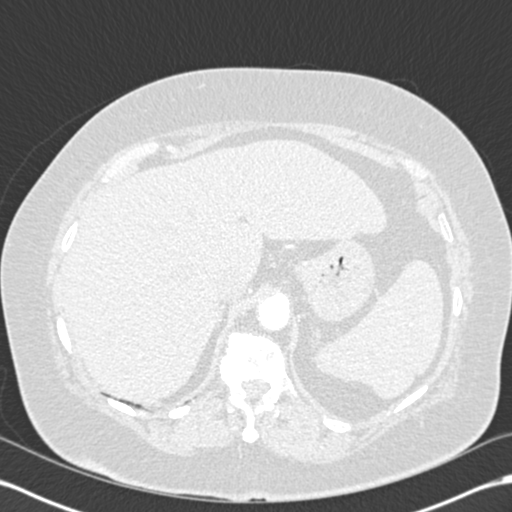
[im 27/261  mediastinal]
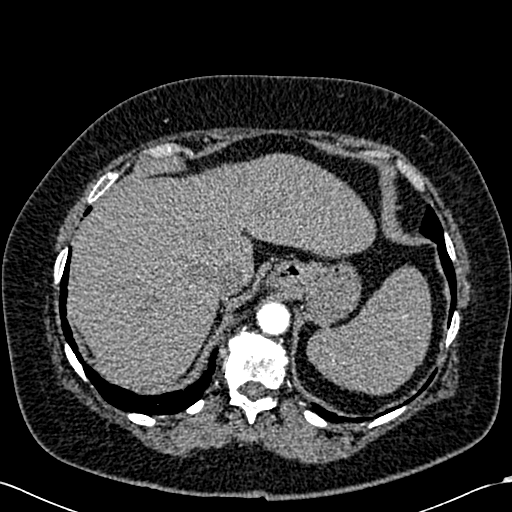
[im 40/261  lung]
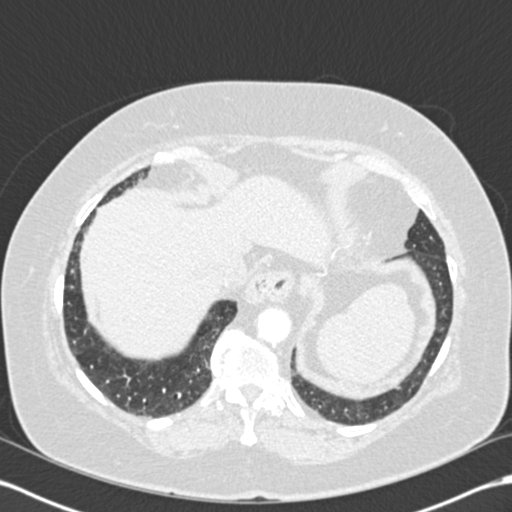
[im 53/261  mediastinal]
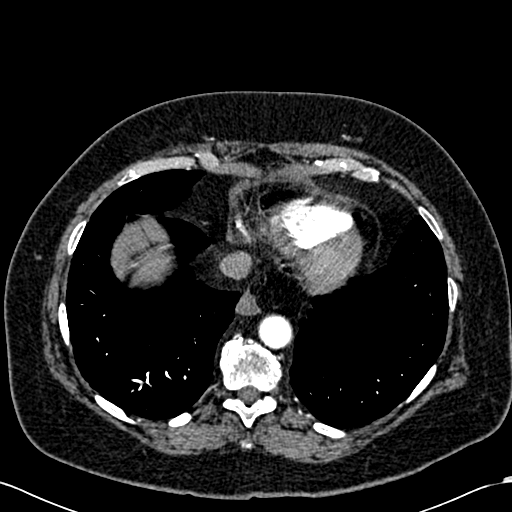
[im 66/261  lung]
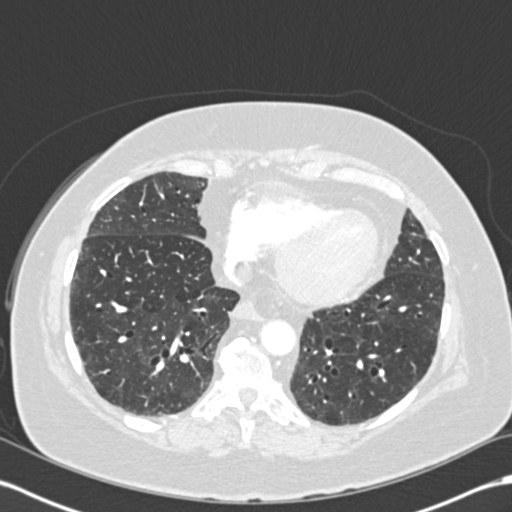
[im 79/261  mediastinal]
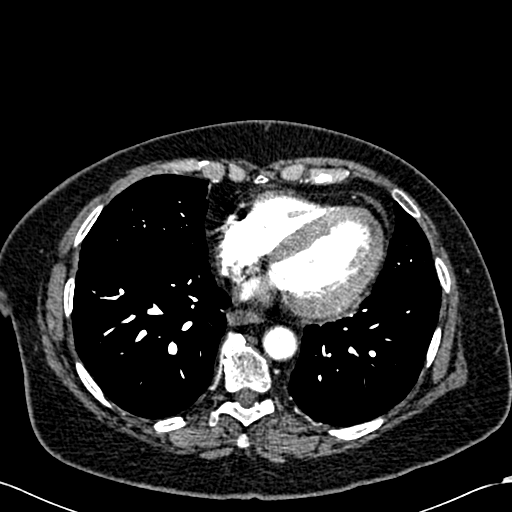
[im 92/261  lung]
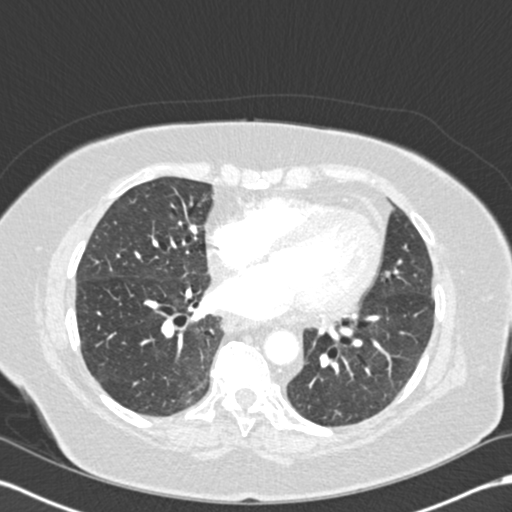
[im 105/261  mediastinal]
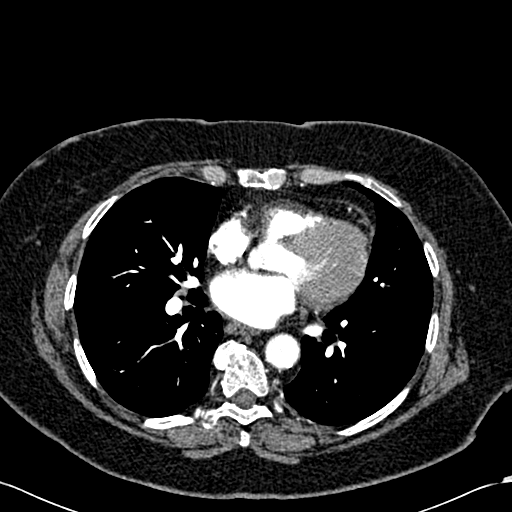
[im 118/261  lung]
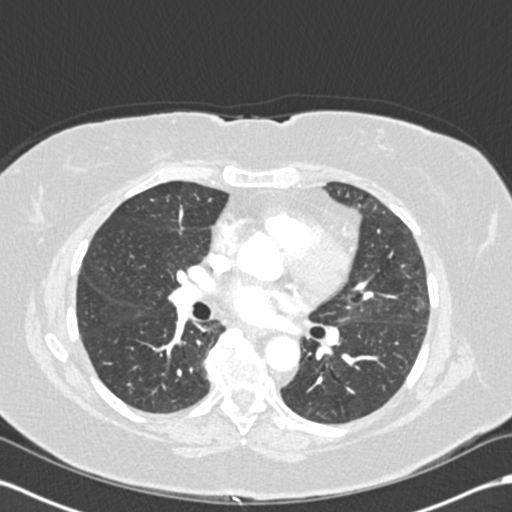
[im 144/261  mediastinal]
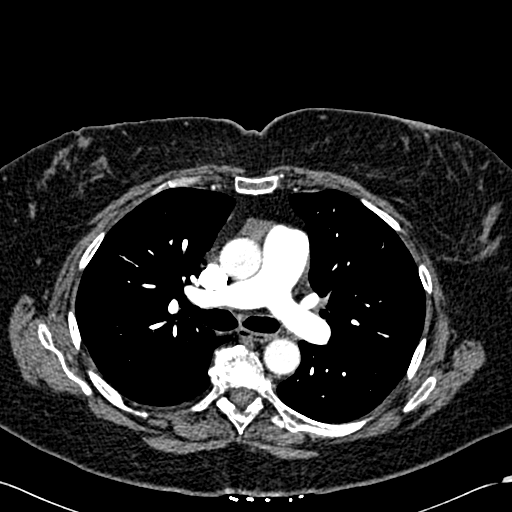
[im 157/261  lung]
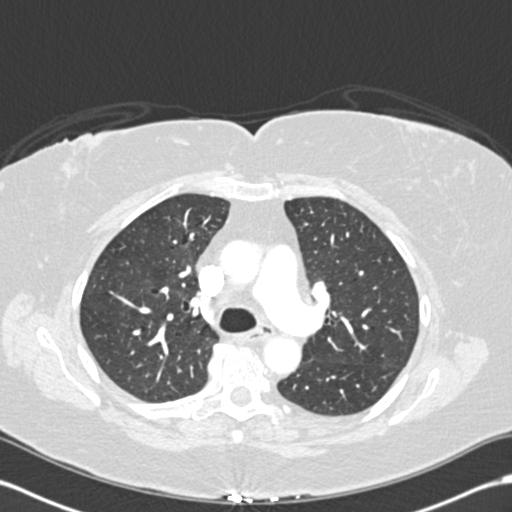
[im 170/261  mediastinal]
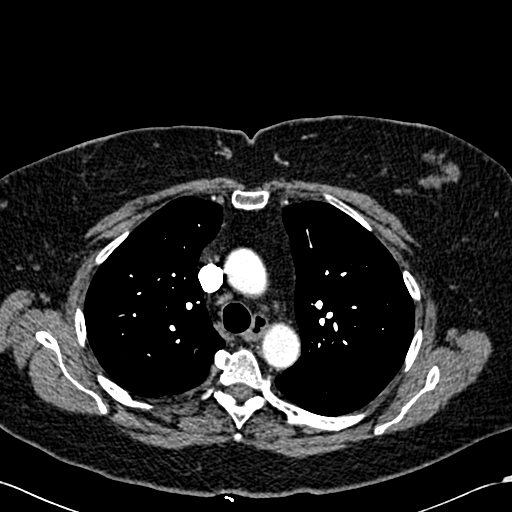
[im 183/261  lung]
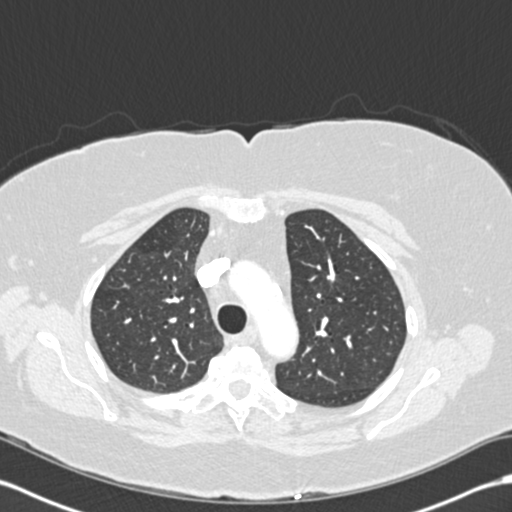
[im 196/261  mediastinal]
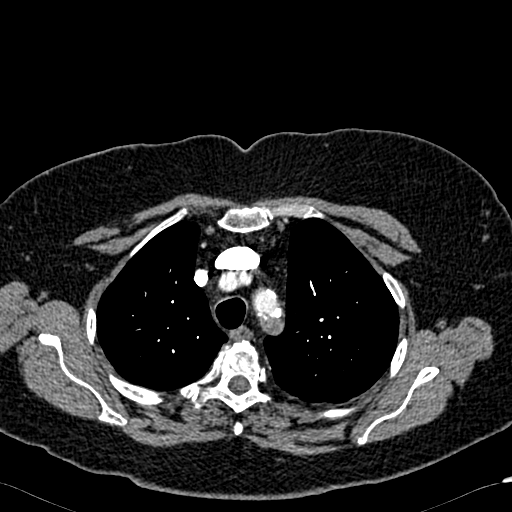
[im 209/261  lung]
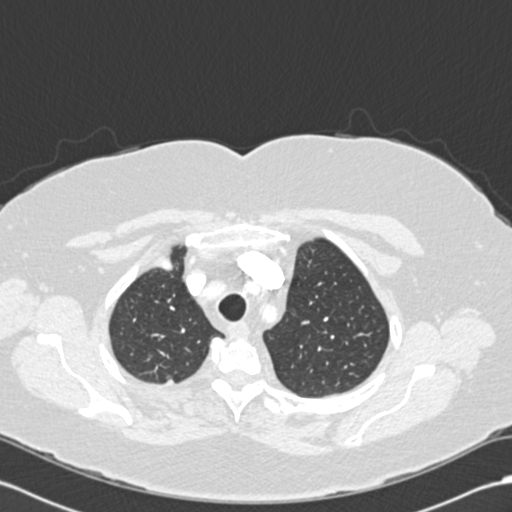
[im 222/261  mediastinal]
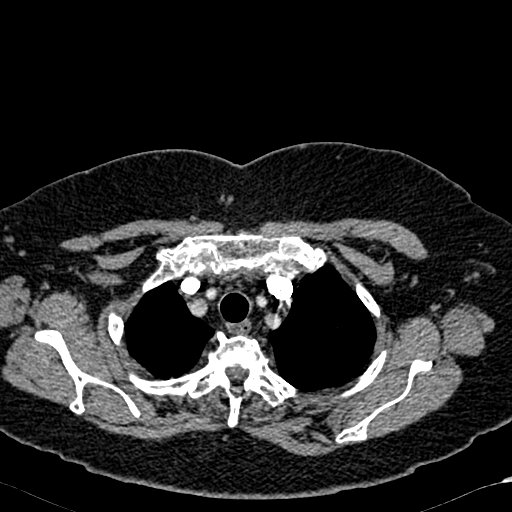
[im 235/261  lung]
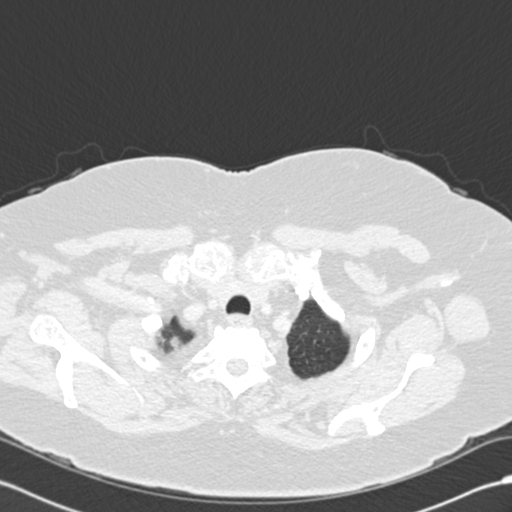
[im 248/261  mediastinal]
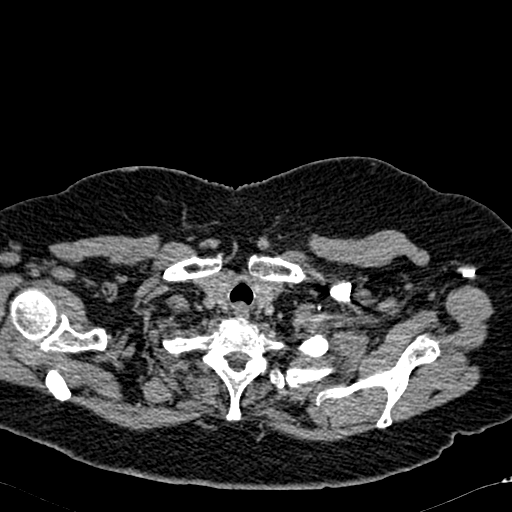

[Series 602: cor mpr · coronal · 0.68mm/px · 1 of 93 slices shown]
[im 47/93  mediastinal]
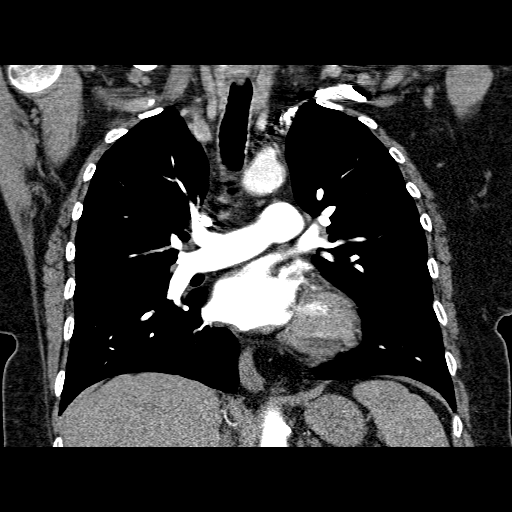

[19 of 36 positions shown; findings below may reference images not displayed]

FINDINGS: There are no filling defects in the pulmonary arterial
tree to suggest acute pulmonary thromboembolism.

No evidence of aortic dissection or aneurysm.  Minimal circumflex
and left anterior descending coronary artery calcification.

Negative pericardial effusion or abnormal adenopathy.

No pneumothorax or pleural effusion.

Minimal dependent ground-glass is likely related to volume loss.
No consolidation or mass.

Advanced degenerative disc disease at T11-T12.

Small hiatal hernia is stable.
IMPRESSION: No evidence of acute pulmonary thromboembolism.  Chronic changes
are noted.

## 2014-07-28 IMAGING — CR DG CHEST 2V
2 series · 2 of 2 positions shown · non-contrast
Comparison: CT chest 10/05/2012 and PA and lateral chest
09/17/2010.

CLINICAL DATA: Shortness of breath for 1 month.

CHEST - 2 VIEW

[view not recorded (1 of 2)]
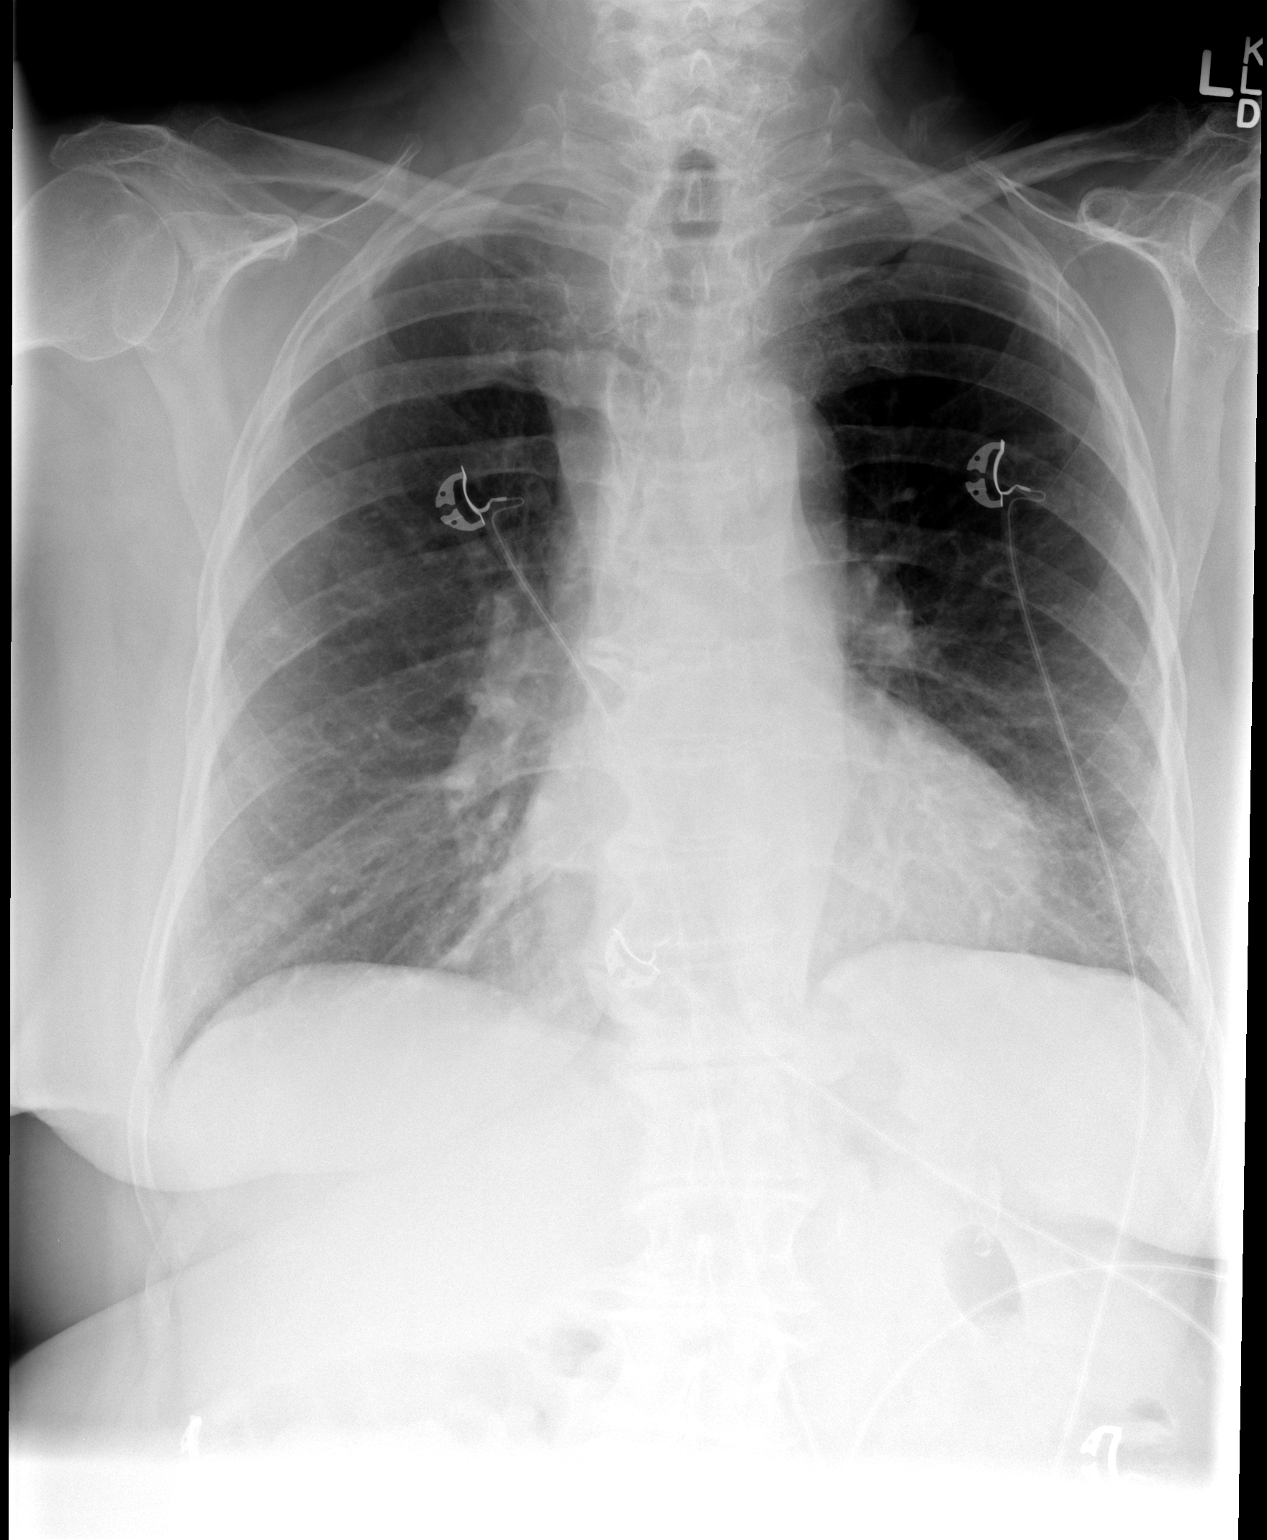

[view not recorded (2 of 2)]
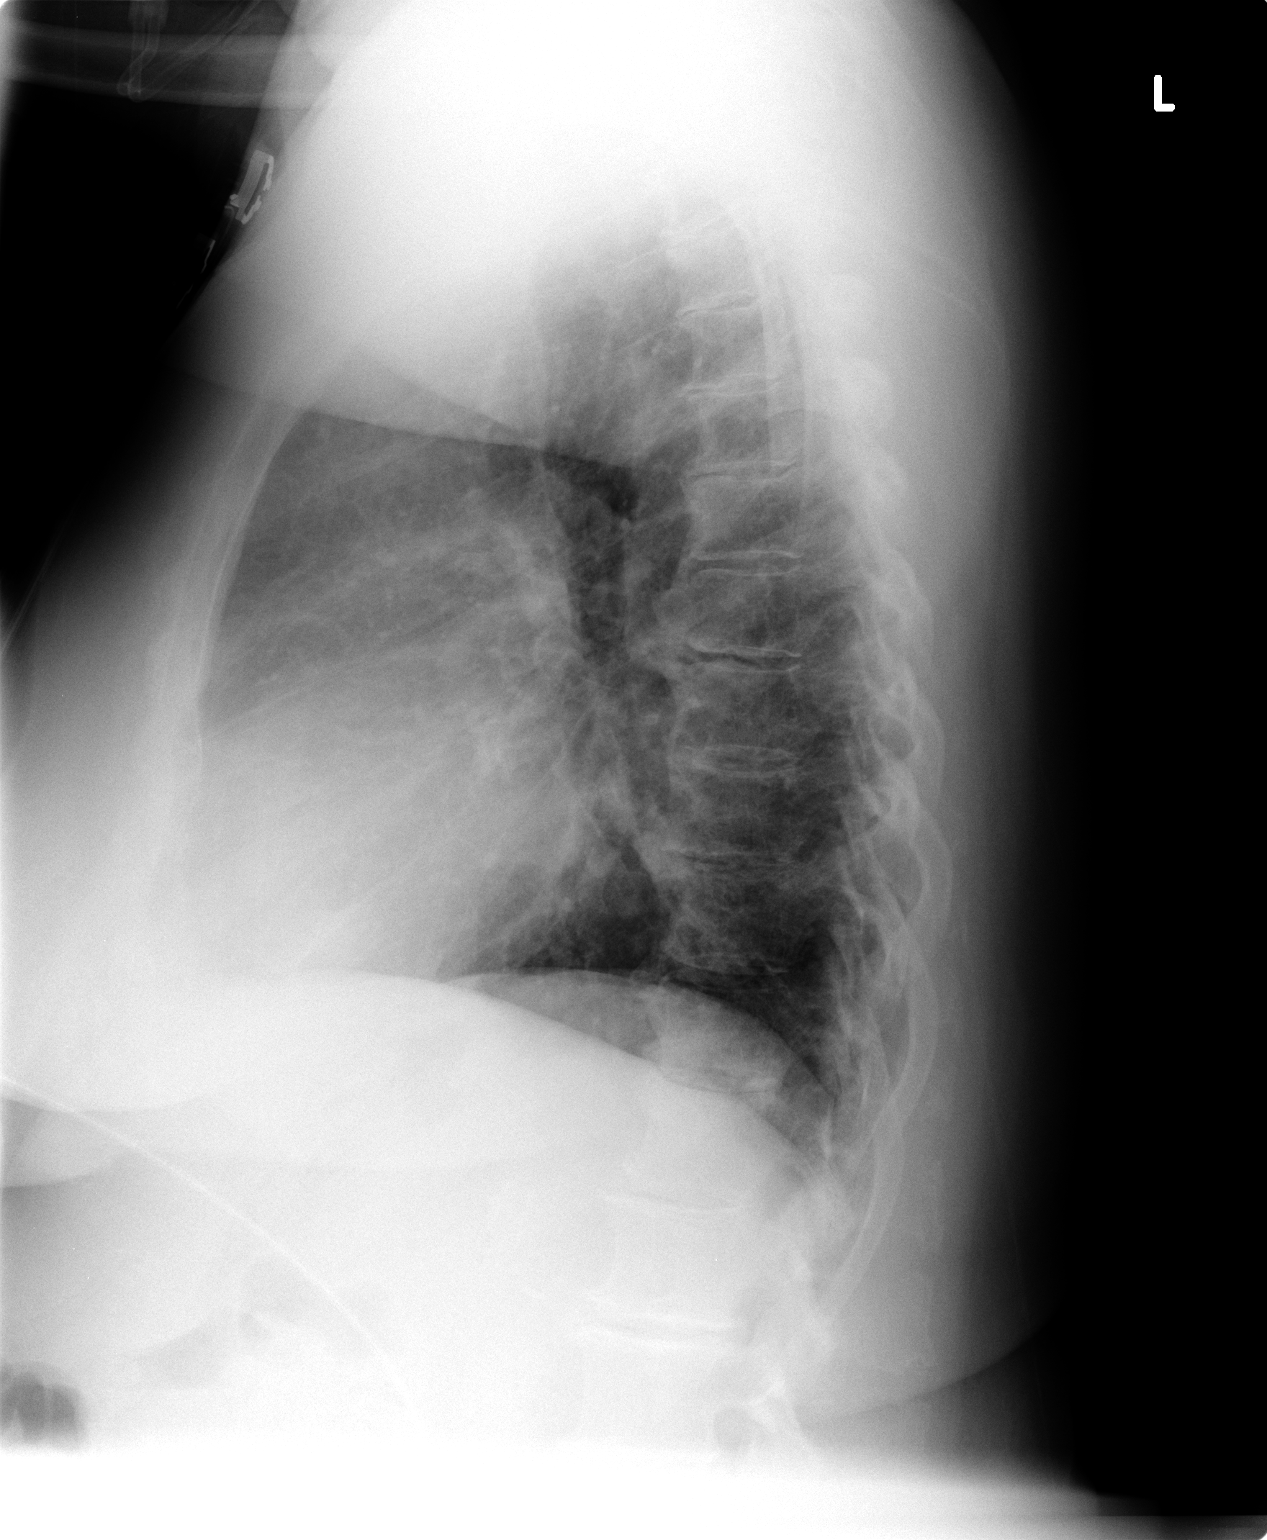

[2 of 2 positions shown; findings below may reference images not displayed]

FINDINGS: Lungs are clear.  Heart size is normal.  No pneumothorax
or pleural fluid.
IMPRESSION: No acute disease.

## 2015-04-22 IMAGING — CR DG CHEST 2V
2 series · 2 of 2 positions shown · non-contrast
Comparison: 01/18/2013 radiographs.

CLINICAL DATA: Difficulty walking today with resulting fall.
History of stroke.

EXAM:
CHEST  2 VIEW

[view not recorded (1 of 2)]
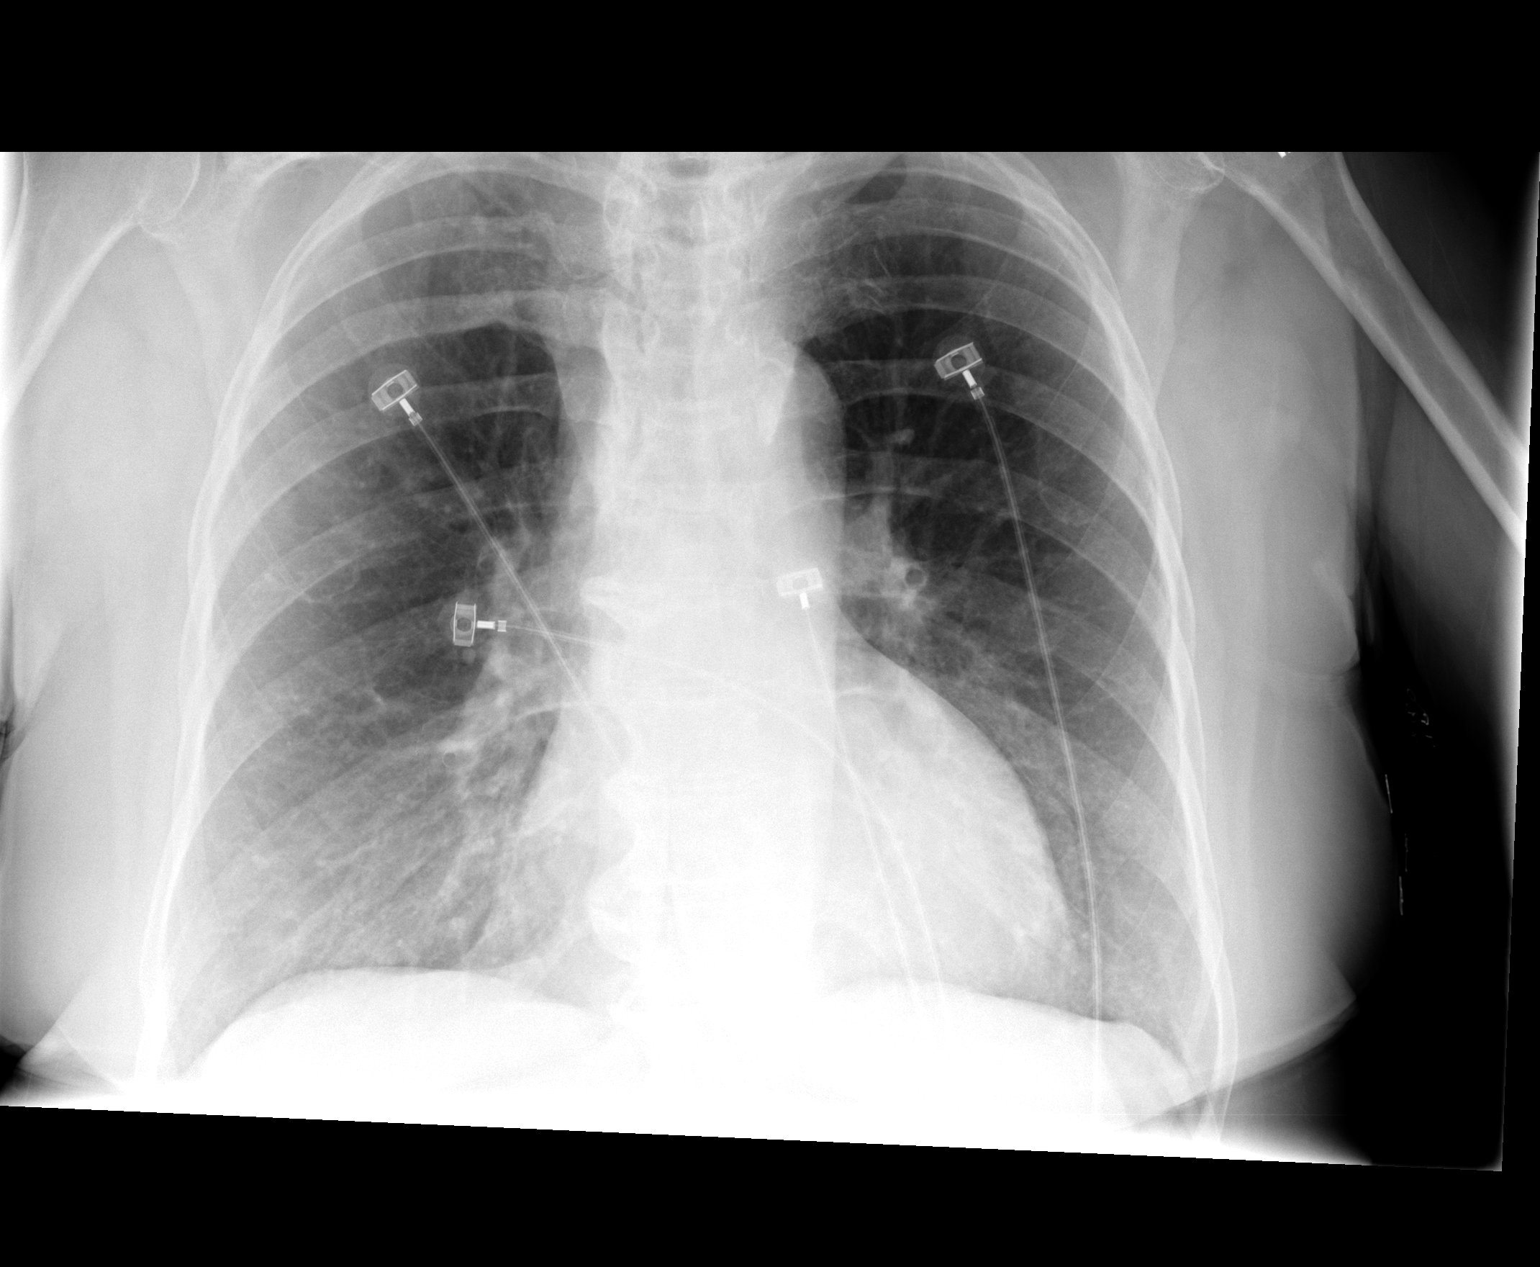

[view not recorded (2 of 2)]
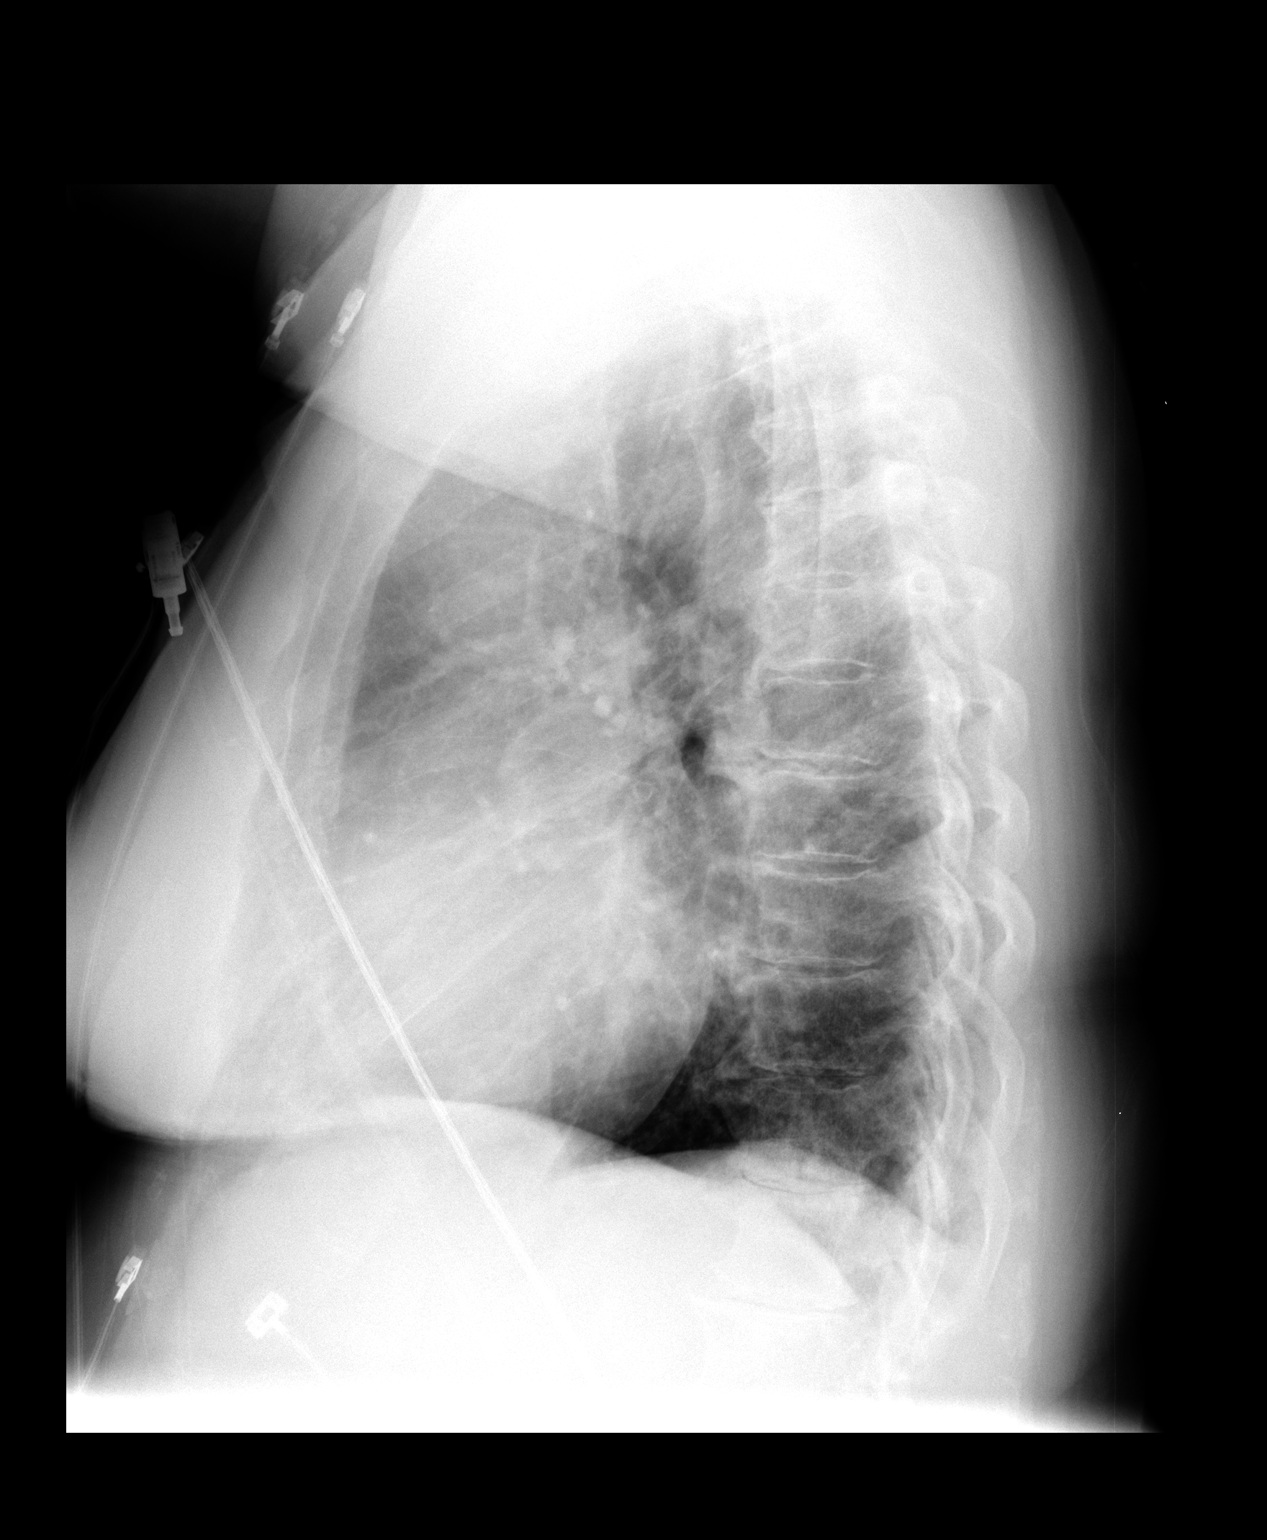

[2 of 2 positions shown; findings below may reference images not displayed]

FINDINGS: The heart size and mediastinal contours are stable with mild cardiac
enlargement. There is improved aeration of the lungs which are now
clear and mildly hyperinflated. There is no pleural effusion or
pneumothorax. No fractures are identified. Degenerative changes are
present throughout the thoracic spine. Telemetry leads overlie the
chest.
IMPRESSION: Chronic obstructive pulmonary disease and mild cardiomegaly. No
acute cardiopulmonary process.

## 2017-12-15 ENCOUNTER — Encounter (INDEPENDENT_AMBULATORY_CARE_PROVIDER_SITE_OTHER): Payer: Self-pay | Admitting: *Deleted

## 2019-12-20 DEATH — deceased
